# Patient Record
Sex: Male | Born: 1965
Health system: Southern US, Community
[De-identification: ages and names within clinical notes are randomized; demographics above are authoritative.]

## PROBLEM LIST (undated history)

## (undated) DIAGNOSIS — E785 Hyperlipidemia, unspecified: Secondary | ICD-10-CM

## (undated) HISTORY — DX: Hyperlipidemia, unspecified: E78.5

## (undated) HISTORY — PX: VASECTOMY: SHX75

## (undated) HISTORY — PX: WISDOM TOOTH EXTRACTION: SHX21

---

## 2001-02-16 ENCOUNTER — Encounter (INDEPENDENT_AMBULATORY_CARE_PROVIDER_SITE_OTHER): Payer: Self-pay | Admitting: Specialist

## 2001-02-16 ENCOUNTER — Other Ambulatory Visit: Admission: RE | Admit: 2001-02-16 | Discharge: 2001-02-16 | Payer: Self-pay | Admitting: Urology

## 2007-01-06 ENCOUNTER — Ambulatory Visit: Payer: Self-pay | Admitting: Family Medicine

## 2007-01-12 ENCOUNTER — Ambulatory Visit: Payer: Self-pay | Admitting: Family Medicine

## 2007-01-12 LAB — CONVERTED CEMR LAB
Albumin: 4 g/dL (ref 3.5–5.2)
Alkaline Phosphatase: 72 units/L (ref 39–117)
BUN: 14 mg/dL (ref 6–23)
Bacteria, UA: NEGATIVE
Basophils Absolute: 0 10*3/uL (ref 0.0–0.1)
Basophils Relative: 0.3 % (ref 0.0–1.0)
Bilirubin Urine: NEGATIVE
Chloride: 103 meq/L (ref 96–112)
Cholesterol: 174 mg/dL (ref 0–200)
Creatinine, Ser: 1 mg/dL (ref 0.4–1.5)
Crystals: NEGATIVE
GFR calc non Af Amer: 89 mL/min
HDL: 24.6 mg/dL — ABNORMAL LOW (ref >39.0)
Hemoglobin: 14.8 g/dL (ref 13.0–17.0)
LDL Cholesterol: 121 mg/dL — ABNORMAL HIGH (ref 0–99)
MCHC: 34.9 g/dL (ref 30.0–36.0)
Monocytes Absolute: 0.4 10*3/uL (ref 0.2–0.7)
Monocytes Relative: 6.8 % (ref 3.0–11.0)
Nitrite: NEGATIVE
Platelets: 234 10*3/uL (ref 150–400)
Potassium: 3.8 meq/L (ref 3.5–5.1)
RBC: 4.65 M/uL (ref 4.22–5.81)
RDW: 11.7 % (ref 11.5–14.6)
Sodium: 138 meq/L (ref 135–145)
Specific Gravity, Urine: >=1.03 (ref 1.000–1.03)
TSH: 0.82 microintl units/mL (ref 0.35–5.50)
Total CHOL/HDL Ratio: 7.1
Triglycerides: 140 mg/dL (ref 0–149)
Urine Glucose: NEGATIVE mg/dL
VLDL: 28 mg/dL (ref 0–40)
pH: 6 (ref 5.0–8.0)

## 2007-01-19 ENCOUNTER — Ambulatory Visit: Payer: Self-pay | Admitting: Family Medicine

## 2008-01-28 ENCOUNTER — Ambulatory Visit: Payer: Self-pay | Admitting: Family Medicine

## 2008-01-28 DIAGNOSIS — F528 Other sexual dysfunction not due to a substance or known physiological condition: Secondary | ICD-10-CM

## 2008-08-24 ENCOUNTER — Ambulatory Visit: Payer: Self-pay | Admitting: Family Medicine

## 2010-03-23 ENCOUNTER — Telehealth (INDEPENDENT_AMBULATORY_CARE_PROVIDER_SITE_OTHER): Payer: Self-pay | Admitting: *Deleted

## 2010-04-09 ENCOUNTER — Ambulatory Visit: Payer: Self-pay | Admitting: Internal Medicine

## 2010-04-09 DIAGNOSIS — E785 Hyperlipidemia, unspecified: Secondary | ICD-10-CM

## 2010-04-09 DIAGNOSIS — F411 Generalized anxiety disorder: Secondary | ICD-10-CM | POA: Insufficient documentation

## 2010-04-09 DIAGNOSIS — R030 Elevated blood-pressure reading, without diagnosis of hypertension: Secondary | ICD-10-CM

## 2010-04-09 DIAGNOSIS — R9431 Abnormal electrocardiogram [ECG] [EKG]: Secondary | ICD-10-CM

## 2010-04-12 ENCOUNTER — Telehealth (INDEPENDENT_AMBULATORY_CARE_PROVIDER_SITE_OTHER): Payer: Self-pay | Admitting: *Deleted

## 2010-05-09 ENCOUNTER — Telehealth (INDEPENDENT_AMBULATORY_CARE_PROVIDER_SITE_OTHER): Payer: Self-pay | Admitting: *Deleted

## 2010-07-31 ENCOUNTER — Ambulatory Visit: Payer: Self-pay | Admitting: Internal Medicine

## 2010-07-31 DIAGNOSIS — J069 Acute upper respiratory infection, unspecified: Secondary | ICD-10-CM | POA: Insufficient documentation

## 2010-11-25 LAB — CONVERTED CEMR LAB
AST: 21 units/L (ref 0–37)
Alkaline Phosphatase: 65 units/L (ref 39–117)
Basophils Relative: 0.3 % (ref 0.0–3.0)
Bilirubin Urine: NEGATIVE
Bilirubin, Direct: 0.1 mg/dL (ref 0.0–0.3)
Calcium: 9.2 mg/dL (ref 8.4–10.5)
Cholesterol, target level: 200 mg/dL
Creatinine, Ser: 1.1 mg/dL (ref 0.4–1.5)
Eosinophils Absolute: 0.1 10*3/uL (ref 0.0–0.7)
Eosinophils Relative: 1.1 % (ref 0.0–5.0)
GFR calc non Af Amer: 81.6 mL/min (ref >60)
Glucose, Urine, Semiquant: NEGATIVE
HDL goal, serum: 40 mg/dL
Lymphocytes Relative: 29.2 % (ref 12.0–46.0)
MCHC: 35.8 g/dL (ref 30.0–36.0)
Monocytes Relative: 5.5 % (ref 3.0–12.0)
Neutrophils Relative %: 63.9 % (ref 43.0–77.0)
RBC: 4.78 M/uL (ref 4.22–5.81)
Sodium: 142 meq/L (ref 135–145)
Specific Gravity, Urine: 1.025
Total Protein: 7.1 g/dL (ref 6.0–8.3)
WBC Urine, dipstick: NEGATIVE
WBC: 6.2 10*3/uL (ref 4.5–10.5)
pH: 5

## 2010-11-27 NOTE — Progress Notes (Signed)
Summary: Moving PCP's  Phone Note Call from Patient Call back at Home Phone [phone] Call back at [phone]   Caller: Patient's Wife Summary of Call: Patient would like to move to one of the physcians's here simply for location reasons. He works on this side of town and it would be closer for him. He would be moving to see Dr. Alwyn Ren. Is this ok? Initial call taken by: Harold Barban,  Mar 23, 2010 2:49 PM  Follow-up for Phone Call        of course that's fine Follow-up by: Nelwyn Salisbury MD,  Mar 23, 2010 2:57 PM  Additional Follow-up for Phone Call Additional follow up Details #1::        Called patient and scheduled an appt with Dr. Alwyn Ren. Additional Follow-up by: Harold Barban,  Mar 23, 2010 3:00 PM

## 2010-11-27 NOTE — Assessment & Plan Note (Signed)
Summary: COUGH AND COLD///SPH   Vital Signs:  Patient profile:   45 year old male Height:      72.25 inches Weight:      209 pounds O2 Sat:      98 % on Room air Temp:     98.0 degrees F oral Pulse rate:   78 / minute Resp:     14 per minute BP sitting:   110 / 82  (left arm)  Vitals Entered By: Jeremy Johann CMA (July 31, 2010 12:56 PM)  O2 Flow:  Room air CC: cough, congestion, URI symptoms   CC:  cough, congestion, and URI symptoms.  History of Present Illness: RTI Symptoms      This is a 45 year old man who presents with  symptoms since 07/28/2010.  The patient reports nasal congestion, purulent nasal discharge, sore throat, and productive cough with  yellow sputum, but denies earache.  Associated symptoms include dyspnea.  The patient denies fever and wheezing.  The patient denies headache.  Risk factors for Strep sinusitis include bilateral facial pain.  The patient denies the following risk factors for Strep sinusitis: tooth pain and tender adenopathy.  Rx: Nyquil; cough drops.                      Hyperlipidemia Follow-Up       NMR results were reviewed; TG were 311. Role of High Fructose Corn Syrup causing TG elevation discussed. Dietary compliance has been good; he has decreased sugar intake , especially colas, with 15# weight loss.  The patient reports no exercise; he is not having chest pain. He believes  chest pain was stress related.  Adjunctive measures currently used by the patient include ASA every other day.   Current Medications (verified): 1)  Aspirin 81 Mg  Tbec (Aspirin) .... As Needed 2)  Fluoxetine Hcl 10 Mg Caps (Fluoxetine Hcl) .Marland Kitchen.. 1 Once Daily  Allergies (verified): No Known Drug Allergies  Physical Exam  General:  in no acute distress; alert,appropriate and cooperative throughout examination Ears:  External ear exam shows no significant lesions or deformities.  Otoscopic examination reveals clear canals, tympanic membranes are intact bilaterally  without bulging, retraction, inflammation or discharge. Hearing is grossly normal bilaterally. Nose:  External nasal examination shows no deformity or inflammation. Nasal mucosa : erythema Mouth:  Oral mucosa and oropharynx without lesions or exudates.  Teeth in good repair. Mild: pharyngeal erythema.   Lungs:  Normal respiratory effort, chest expands symmetrically. Lungs are clear to auscultation, no crackles or wheezes. Heart:  Normal rate and regular rhythm. S1 and S2 normal without gallop, murmur, click, rub.S4 Skin:  Intact without suspicious lesions or rashes Cervical Nodes:  No lymphadenopathy noted Axillary Nodes:  No palpable lymphadenopathy Psych:  Focused & intelligent   Impression & Recommendations:  Problem # 1:  URI (ICD-465.9)  His updated medication list for this problem includes:    Aspirin 81 Mg Tbec (Aspirin) .Marland Kitchen... As needed    Hydromet 5-1.5 Mg/65ml Syrp (Hydrocodone-homatropine) .Marland Kitchen... 1 tsp every 6 hrs as needed cough  Problem # 2:  BRONCHITIS-ACUTE (ICD-466.0)  His updated medication list for this problem includes:    Amoxicillin 500 Mg Caps (Amoxicillin) .Marland Kitchen... 1 three times a day    Hydromet 5-1.5 Mg/45ml Syrp (Hydrocodone-homatropine) .Marland Kitchen... 1 tsp every 6 hrs as needed cough  Problem # 3:  HYPERLIPIDEMIA (ICD-272.4)  Complete Medication List: 1)  Aspirin 81 Mg Tbec (Aspirin) .... As needed 2)  Fluoxetine  Hcl 10 Mg Caps (Fluoxetine hcl) .Marland Kitchen.. 1 once daily 3)  Amoxicillin 500 Mg Caps (Amoxicillin) .Marland Kitchen.. 1 three times a day 4)  Hydromet 5-1.5 Mg/27ml Syrp (Hydrocodone-homatropine) .Marland Kitchen.. 1 tsp every 6 hrs as needed cough  Patient Instructions: 1)  Neti pot once daily as needed for head congestion. 2)  Drink as much NON dairy fluid as you can tolerate for the next few days. Consume LESS THAN 40 grams of HFCS sugar/ day as discussed. Check fasting Lipids annually. 3)  It is important that you exercise regularly at least 20 minutes 5 times a week. If you develop chest  pain, have severe difficulty breathing, or feel very tired , stop exercising immediately and seek medical attention. 4)  Take an 81 mg coated Aspirin every day. Prescriptions: HYDROMET 5-1.5 MG/5ML SYRP (HYDROCODONE-HOMATROPINE) 1 tsp every 6 hrs as needed cough  #120 cc x 0   Entered and Authorized by:   Marga Melnick MD   Signed by:   Marga Melnick MD on 07/31/2010   Method used:   Print then Give to Patient   RxID:   [id number] AMOXICILLIN 500 MG CAPS (AMOXICILLIN) 1 three times a day  #30 x 0   Entered and Authorized by:   Marga Melnick MD   Signed by:   Marga Melnick MD on 07/31/2010   Method used:   Print then Give to Patient   RxID:   [id number]

## 2010-11-27 NOTE — Progress Notes (Signed)
Summary: Cardiolite Denial  Phone Note Outgoing Call   Summary of Call: I tried to call and add the information to the case but since it has been fully denied it has to be appealed by a MD. A peer to peer review needs to be done for this denial to be appealed. Please call [phone] to inatiate this. Or you can let me know when you are ready to call and I will call and set everything up for you.  Initial call taken by: Harold Barban,  May 09, 2010 9:35 AM  Follow-up for Phone Call        Let him know his insurance has denied the Nuclear Stress Test. He should report any chest pain ,especially with exertion.  A routine cholesterol panel needs to be repeated after 4 months  of nutritional changes ( Consume less than 40 grams of sugar / day from foods & drinks with High Fructose Corn Syrup as #1, 2 or #3 on label as HFCS raises Triglycerides). Monitor BP; goal = average < 135/85. Hopp Follow-up by: Marga Melnick MD,  May 20, 2010 11:30 AM  Additional Follow-up for Phone Call Additional follow up Details #1::        I called patient on home number-no a working number, I called patient at work and was told he was not in to try agian later Additional Follow-up by: Shonna Chock CMA,  May 21, 2010 4:00 PM    Additional Follow-up for Phone Call Additional follow up Details #2::    I checked reg screen and called patient on cell:[phone], patient ok'd all information and schedule appointment for 08/2010 to check chlosterol./Chrae [facility] CMA  May 21, 2010 4:12 PM

## 2010-11-27 NOTE — Progress Notes (Signed)
Summary: Cardiolite Denial  Phone Note Other Incoming   Caller: AIM Summary of Call: Patient's insurance will not authorize his caridolite. It went to a MD level and they denied it.   They stated: Based upon additional physciann review, this request cannot be approved at this time because there was no above intermediate pre-test probability of coronary artery disease calculation based on age,gender, and sypmtoms or above moderate risk documentation such as HTN, DM, or abnormal lipid profile.   Please advise. Initial call taken by: Harold Barban,  April 12, 2010 11:14 AM  Follow-up for Phone Call        I request a doctor to doctor review. He has an abnormal EKG with ST-T changes inferolaterally. 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 I am concerned about him continuing to exercise with this worrisome complex.I request review of these risks as support for stress test. WF Hopper FACP,FCCP  Additional Follow-up by: Marga Melnick MD,  April 20, 2010 4:52 PM    Additional Follow-up for Phone Call Additional follow up Details #2::    Called and spoke with representative @ insurance and she said we can appeal the claim, but we will have to wait for Dr. Alwyn Ren to be back in the office to perform the doctor to doctor review. Will call back tuesday when you return.  Follow-up by: Harold Barban,  April 26, 2010 9:21 AM  Additional Follow-up for Phone Call Additional follow up Details #3:: Details for Additional Follow-up Action Taken: Noted;  please send patient copy of all phone notes to keep him informed of process Additional Follow-up by: Marga Melnick MD,  April 26, 2010 9:23 AM

## 2010-11-27 NOTE — Assessment & Plan Note (Signed)
Summary: reest//pt requesting a cpx//lch   Vital Signs:  Patient profile:   45 year old male Height:      72.25 inches Weight:      212.4 pounds BMI:     28.71 Temp:     97.8 degrees F oral Pulse rate:   76 / minute Resp:     14 per minute BP sitting:   126 / 84  (left arm) Cuff size:   large  Vitals Entered By: Shonna Chock (April 09, 2010 11:35 AM) CC: New Establish-CPX with fasting labs , Hypertension Management, Lipid Management Comments Not taking and RX'ed meds   CC:  New Establish-CPX with fasting labs , Hypertension Management, and Lipid Management.  History of Present Illness: Jason Benson is here for a physical; he is concerned about  elevated  BP readings pre & post exercise. BP 146/67 pre & 167/? 84 post. He is exercising @ a cardio level.   Hypertension History:      He denies headache, chest pain, palpitations, dyspnea with exertion, orthopnea, PND, peripheral edema, visual symptoms, neurologic problems, and syncope.  Headache only with prolonged fasting.        Positive major cardiovascular risk factors include hyperlipidemia.  Negative major cardiovascular risk factors include male age less than 70 years old, no history of diabetes, no history of hypertension, negative family history for ischemic heart disease, and non-tobacco-user status.        Further assessment for target organ damage reveals no history of ASHD, stroke/TIA, or peripheral vascular disease.    Lipid Management History:      Positive NCEP/ATP III risk factors include HDL cholesterol less than 40.  Negative NCEP/ATP III risk factors include male age less than 57 years old, non-diabetic, no family history for ischemic heart disease, non-tobacco-user status, non-hypertensive, no ASHD (atherosclerotic heart disease), no prior stroke/TIA, no peripheral vascular disease, and no history of aortic aneurysm.      Preventive Screening-Counseling & Management  Caffeine-Diet-Exercise     Does Patient Exercise:  yes  Allergies (verified): No Known Drug Allergies  Past History:  Past Medical History: Allergies Hyperlipidemia  Past Surgical History: Vasectomy ; Wisdom Teeth extraction  Family History: Father: DM,OA, MI Mother: negative Siblings: negative; P uncle : ? lung cancer  Social History: Occupation:Restaurant Owner Married Never Smoked Alcohol use-no Regular exercise-yes: Ellipitical X 30 min & weights X 30 min every other day w/o symptoms Does Patient Exercise:  yes  Review of Systems       The patient complains of anorexia.  The patient denies fever, weight loss, weight gain, vision loss, decreased hearing, hoarseness, prolonged cough, hemoptysis, abdominal pain, melena, hematochezia, severe indigestion/heartburn, hematuria, suspicious skin lesions, unusual weight change, abnormal bleeding, enlarged lymph nodes, and angioedema.   Neuro:  Denies disturbances in coordination, numbness, poor balance, and tingling. Psych:  Complains of anxiety, depression, easily angered, easily tearful, and irritability; He has stress of  owning 4 restaurants ; also family  membersstill live in [place].  Physical Exam  General:  well-nourished,in no acute distress; alert,appropriate and cooperative throughout examination Head:  Normocephalic and atraumatic without obvious abnormalities. No apparent alopecia Eyes:  No corneal or conjunctival inflammation noted.Perrla. Funduscopic exam benign, without hemorrhages, exudates or papilledema.  Ears:  External ear exam shows no significant lesions or deformities.  Otoscopic examination reveals clear canals, tympanic membranes are intact bilaterally without bulging, retraction, inflammation or discharge. Hearing is grossly normal bilaterally. Nose:  External nasal examination shows no deformity or inflammation. Nasal  mucosa are pink and moist without lesions or exudates. Mouth:  Oral mucosa and oropharynx without lesions or exudates.  Teeth in good  repair. Neck:  No deformities, masses, or tenderness noted. Lungs:  Normal respiratory effort, chest expands symmetrically. Lungs are clear to auscultation, no crackles or wheezes. Heart:  Normal rate and regular rhythm. S1 and S2 normal without gallop, murmur, click, rub. S4 Abdomen:  Bowel sounds positive,abdomen soft and non-tender without masses, organomegaly or hernias noted. Rectal:  No external abnormalities noted. Normal sphincter tone. No rectal masses or tenderness. Genitalia:  Testes bilaterally descended without nodularity, tenderness or masses. No scrotal masses or lesions. No penis lesions or urethral discharge. L varicocele.   Prostate:  Prostate gland firm and smooth, no enlargement, nodularity, tenderness, mass, asymmetry or induration. Msk:  No deformity or scoliosis noted of thoracic or lumbar spine.   Pulses:  R and L carotid,radial,dorsalis pedis and posterior tibial pulses are full and equal bilaterally Extremities:  No clubbing, cyanosis, edema, or deformity noted with normal full range of motion of all joints.   Neurologic:  alert & oriented X3, gait normal, and DTRs symmetrical and normal.   Skin:  Intact without suspicious lesions or rashes Cervical Nodes:  No lymphadenopathy noted Axillary Nodes:  No palpable lymphadenopathy Inguinal Nodes:  No significant adenopathy Psych:  memory intact for recent and remote, normally interactive, good eye contact, not anxious appearing, and not depressed appearing.     Impression & Recommendations:  Problem # 1:  ROUTINE GENERAL MEDICAL EXAM@HEALTH  CARE FACL (ICD-V70.0)  Orders: EKG w/ Interpretation (93000) Venipuncture (16109) TLB-BMP (Basic Metabolic Panel-BMET) (80048-METABOL) TLB-CBC Platelet - w/Differential (85025-CBCD) TLB-Hepatic/Liver Function Pnl (80076-HEPATIC) TLB-TSH (Thyroid Stimulating Hormone) (84443-TSH) UA Dipstick w/o Micro (manual) (60454) Specimen Handling (99000) T-Culture, Urine  (09811-91478)  Problem # 2:  ELEVATED BLOOD PRESSURE WITHOUT DIAGNOSIS OF HYPERTENSION (ICD-796.2)  Orders: EKG w/ Interpretation (93000) Venipuncture (29562) TLB-BMP (Basic Metabolic Panel-BMET) (80048-METABOL) T- * Misc. Laboratory test [phone]) Cardiolite (Cardiolite)  Problem # 3:  HYPERLIPIDEMIA (ICD-272.4)  Orders: Venipuncture (57846) Cardiolite (Cardiolite)  Problem # 4:  ANXIETY STATE, UNSPECIFIED (ICD-300.00)  The following medications were removed from the medication list:    Lexapro 10 Mg Tabs (Escitalopram oxalate) ..... Once daily His updated medication list for this problem includes:    Fluoxetine Hcl 10 Mg Caps (Fluoxetine hcl) .Marland Kitchen... 1 once daily  Problem # 5:  FAMILY HISTORY OF ISCHEMIC HEART DISEASE (ICD-V17.3)  Orders: Venipuncture (96295) T- * Misc. Laboratory test [phone]) Cardiolite (Cardiolite)  Complete Medication List: 1)  Aspirin 81 Mg Tbec (Aspirin) .... As needed 2)  Fluoxetine Hcl 10 Mg Caps (Fluoxetine hcl) .Marland Kitchen.. 1 once daily  Other Orders: Tdap => 77yrs IM (24401) Admin 1st Vaccine (02725)  Hypertension Assessment/Plan:      The patient's hypertensive risk group is category B: At least one risk factor (excluding diabetes) with no target organ damage.  His calculated 10 year risk of coronary heart disease is 6 %.  Today's blood pressure is 126/84.    Lipid Assessment/Plan:      Based on NCEP/ATP III, the patient's risk factor category is "0-1 risk factors".  The patient's lipid goals are as follows: Total cholesterol goal is 200; LDL cholesterol goal is 160; HDL cholesterol goal is 40; Triglyceride goal is 150.    Patient Instructions: 1)  Please keep appt for stress test Prescriptions: FLUOXETINE HCL 10 MG CAPS (FLUOXETINE HCL) 1 once daily  #30 x 5   Entered and Authorized by:  Marga Melnick MD   Signed by:   Marga Melnick MD on 04/09/2010   Method used:   Print then Give to Patient   RxID:   [id number]    Immunizations  Administered:  Tetanus Vaccine:    Vaccine Type: Tdap    Site: right deltoid    Mfr: GlaxoSmithKline    Dose: 0.5 ml    Route: IM    Given by: Chrae Malloy    Exp. Date: 01/20/2012    Lot #: [id number]    VIS given: 09/15/07 version given April 09, 2010.    Laboratory Results   Urine Tests   Date/Time Reported: April 09, 2010 1:06 PM   Routine Urinalysis   Color: yellow Appearance: Clear Glucose: negative   (Normal Range: Negative) Bilirubin: negative   (Normal Range: Negative) Ketone: negative   (Normal Range: Negative) Spec. Gravity: 1.025   (Normal Range: 1.003-1.035) Blood: trace-intact   (Normal Range: Negative) pH: 5.0   (Normal Range: 5.0-8.0) Protein: negative   (Normal Range: Negative) Urobilinogen: negative   (Normal Range: 0-1) Nitrite: negative   (Normal Range: Negative) Leukocyte Esterace: negative   (Normal Range: Negative)    Comments: Floydene Flock  April 09, 2010 1:07 PM cx sent

## 2011-03-15 NOTE — Assessment & Plan Note (Signed)
[facility]                            [facility] OFFICE NOTE   NAME:Jason Benson, Jason Benson                      MRN:          [id number]  DATE:01/19/2007                            DOB:          01-03-1966    This is a 45 year old gentleman here for a complete physical  examination.  In general, he is doing well except for some continued  problems of a sinus infection.  I saw him on March 11 and diagnosed him  with a sinusitis.  I prescribed a 10-day course of Biaxin XL 500 mg to  take two tablets a day.  He could only take 4 days' worth of this,  however, due to diarrhea and then he stopped it.  He says his sinus  infection is much better but he still has continued symptoms of sinus  pressure, postnasal drainage, dry cough, etc.  For further details of  his past medical history, family history, social history, habits, etc.,  refer to our last physical note dated June 08, 2004.   ALLERGIES:  None.   CURRENT MEDICATIONS:  None.   OBJECTIVE:  VITAL SIGNS:  Height 6 feet 0 inches, weight 198, BP 102/74,  pulse 80 and regular, temperature 97.9 degrees.  GENERAL:  He appears to be healthy.  Skin is clear.  HEENT:  Eyes are clear, ears clear, pharynx clear.  NECK:  Supple without lymphadenopathy or masses.  LUNGS:  Clear.  CARDIAC:  Rate and rhythm regular without gallops, murmurs, rubs.  Distal pulses are full.  ABDOMEN:  Soft, normal bowel sounds, nontender, no masses.  GENITALIA:  Normal male.  EXTREMITIES:  No clubbing, cyanosis, or edema.  NEUROLOGIC:  Grossly intact.   He was here for fasting labs on March 17.  These were all normal except  for his lipid panel.  HDL was quite low at 24 and LDL was high at 121.   ASSESSMENT AND PLAN:  1. Complete physical.  I encouraged him to get more regular exercise.  2. Dyslipidemia.  We talked about dietary changes he could make.  3. Partially treated sinusitis.  Omnicef 300 mg two capsules a day for  the next 7 days.     Tera Mater. Clent Ridges, MD  Electronically Signed    SAF/MedQ  DD: 01/20/2007  DT: 01/20/2007  Job #: [id number]

## 2012-01-21 ENCOUNTER — Ambulatory Visit (INDEPENDENT_AMBULATORY_CARE_PROVIDER_SITE_OTHER): Payer: Self-pay | Admitting: Internal Medicine

## 2012-01-21 ENCOUNTER — Telehealth: Payer: Self-pay | Admitting: Internal Medicine

## 2012-01-21 ENCOUNTER — Encounter: Payer: Self-pay | Admitting: Internal Medicine

## 2012-01-21 VITALS — BP 128/90 | HR 60 | Temp 98.1°F | Wt 216.0 lb

## 2012-01-21 DIAGNOSIS — J019 Acute sinusitis, unspecified: Secondary | ICD-10-CM

## 2012-01-21 DIAGNOSIS — J209 Acute bronchitis, unspecified: Secondary | ICD-10-CM

## 2012-01-21 MED ORDER — HYDROCODONE-HOMATROPINE 5-1.5 MG/5ML PO SYRP: 5.0000 mL | ORAL_SOLUTION | Freq: Four times a day (QID) | ORAL | Status: AC | PRN

## 2012-01-21 MED ORDER — AMOXICILLIN 500 MG PO CAPS: 500.0000 mg | ORAL_CAPSULE | Freq: Three times a day (TID) | ORAL | Status: AC

## 2012-01-21 NOTE — Progress Notes (Signed)
  Subjective:    [name], male    DOB: 1966-08-06, 46 y.o.   MRN: [id number]  HPI Respiratory tract infection Onset/symptoms:01/12/12 as dry cough Exposures (illness/environmental/extrinsic):daughter ill Progression of symptoms:to head congestion Treatments/response:Zicam, ASA with some benefit Present symptoms: Fever/chills/sweats:no Frontal headache:yes Facial pain:yes Nasal purulence:yes Sore throat:yes Dental pain:yes Lymphadenopathy:no Wheezing/shortness of breath:SOB Cough/sputum/hemoptysis:light yellow Pleuritic pain:no Associated extrinsic/allergic symptoms:itchy eyes/ sneezing:no Past medical history: Seasonal allergies /asthma:no Smoking history:never           Review of Systems He does have epistaxis in the winter from nasal drying; he has been using a humidifier and intranasal Vicks salve     Objective:   Physical Exam General appearance:good health ;well nourished; no acute distress or increased work of breathing is present.  No  lymphadenopathy about the head, neck, or axilla noted.   Eyes: No conjunctival inflammation or lid edema is present.   Ears:  External ear exam shows no significant lesions or deformities.  Otoscopic examination reveals clear canals, tympanic membranes are intact bilaterally without bulging, retraction, inflammation or discharge.  Nose:  External nasal examination shows no deformity or inflammation. Nasal mucosa are dry & erythematous especially on R; no lesions or exudates. No septal dislocation .No obstruction to airflow.   Oral exam: Dental hygiene is good; lips and gums are healthy appearing.There is no oropharyngeal erythema or exudate noted.   Neck:  No deformities, thyromegaly, masses, or tenderness noted.   Supple with full range of motion without pain.   Heart:  Normal rate and regular rhythm. S1 and S2 normal without gallop, murmur, click, rub or other extra sounds.   Lungs:Chest clear to  auscultation; no wheezes, rhonchi,rales ,or rubs present.No increased work of breathing.    Extremities:  No cyanosis, edema, or clubbing  noted    Skin: Warm & dry           Assessment & Plan:  #1 sinusitis, acute  #2 bronchitis with scant sputum  #3 epistaxis from nasal drying  Plan: See orders and recommendations

## 2012-01-21 NOTE — Telephone Encounter (Signed)
Patient called & stated pharmacy did not get cough syrup script. Please resend below  HYDROcodone-homatropine (HYDROMET) 5-1.5 MG/5ML syrup 120 mL 0 01/21/2012 01/31/2012 Take 5 mLs by mouth every 6 (six) hours as needed for cough. - Oral

## 2012-01-21 NOTE — Telephone Encounter (Signed)
RX was faxed

## 2012-01-21 NOTE — Patient Instructions (Signed)
Plain Mucinex for thick secretions ;force NON dairy fluids. Use a Neti pot daily as needed for sinus congestion. Nasal cleansing in the shower as discussed. Make sure that all residual soap is removed to prevent irritation. Use Eucerin  twice a day  for the drying.

## 2012-08-17 ENCOUNTER — Ambulatory Visit (INDEPENDENT_AMBULATORY_CARE_PROVIDER_SITE_OTHER): Payer: BC Managed Care – PPO | Admitting: Internal Medicine

## 2012-08-17 ENCOUNTER — Encounter: Payer: Self-pay | Admitting: Internal Medicine

## 2012-08-17 VITALS — BP 118/84 | HR 74 | Temp 98.1°F | Wt 212.2 lb

## 2012-08-17 DIAGNOSIS — Z23 Encounter for immunization: Secondary | ICD-10-CM

## 2012-08-17 DIAGNOSIS — Z9189 Other specified personal risk factors, not elsewhere classified: Secondary | ICD-10-CM

## 2012-08-17 DIAGNOSIS — Z202 Contact with and (suspected) exposure to infections with a predominantly sexual mode of transmission: Secondary | ICD-10-CM

## 2012-08-17 MED ORDER — DOXYCYCLINE HYCLATE 100 MG PO TABS: 100.0000 mg | ORAL_TABLET | Freq: Two times a day (BID) | ORAL | Status: DC

## 2012-08-17 NOTE — Patient Instructions (Addendum)
Avoid direct sun while on this medication. Avoid unprotected sex for at least 7 days after starting the medication

## 2012-08-17 NOTE — Progress Notes (Signed)
  Subjective:    [name], male    DOB: 05/14/1966, 46 y.o.   MRN: [id number]  HPI He may have been exposed to chlamydia approximately 3 weeks ago; his partner was diagnosed with this and has been treated. He does have some frequency but this appears to relate to volume of fluids he drinks; otherwise he is totally asymptomatic    Review of Systems He denies fever, chills, sweats, weight loss, dysuria, pyuria, or hematuria. He denies any genital lesions or other skin rash.     Objective:   Physical Exam General appearance is one of good health and nourishment w/o distress.  Eyes: No conjunctival inflammation or scleral icterus is present.  Oral exam: Dental hygiene is good; lips and gums are healthy appearing.There is no oropharyngeal erythema or exudate noted.   Heart:  Normal rate and regular rhythm. S1 and S2 normal without gallop, murmur, click, rub or other extra sounds     Lungs:Chest clear to auscultation; no wheezes, rhonchi,rales ,or rubs present.No increased work of breathing.   Abdomen: bowel sounds normal, soft and non-tender without masses, organomegaly or hernias noted.  No guarding or rebound   Skin:Warm & dry.  Intact without suspicious lesions or rashes   Genitalia normal except for left varices   Lymphatic: No lymphadenopathy is noted about the head, neck, axilla, or inguinal areas.              Assessment & Plan:  #1 possible STD exposure Plan: See orders and recommendations

## 2012-11-02 ENCOUNTER — Encounter: Payer: Self-pay | Admitting: Internal Medicine

## 2012-11-02 ENCOUNTER — Ambulatory Visit (INDEPENDENT_AMBULATORY_CARE_PROVIDER_SITE_OTHER): Payer: BC Managed Care – PPO | Admitting: Internal Medicine

## 2012-11-02 VITALS — BP 114/80 | HR 66 | Temp 97.8°F | Resp 12 | Ht 72.0 in | Wt 212.8 lb

## 2012-11-02 DIAGNOSIS — E785 Hyperlipidemia, unspecified: Secondary | ICD-10-CM

## 2012-11-02 DIAGNOSIS — R9431 Abnormal electrocardiogram [ECG] [EKG]: Secondary | ICD-10-CM

## 2012-11-02 DIAGNOSIS — Z0000 Encounter for general adult medical examination without abnormal findings: Secondary | ICD-10-CM

## 2012-11-02 NOTE — Patient Instructions (Addendum)
Please take enteric-coated aspirin 81 mg daily with breakfast.   If you activate My Chart; the results can be released to you as soon as they populate from the lab. If you choose not to use this program; the labs have to be reviewed, copied & mailed   causing a delay in getting the results to you.  Take the EKG to any emergency room or preop visits. There are nonspecific changes; as long as there is no new change these are not clinically significant.To prevent palpitations or premature beats, avoid stimulants such as decongestants, diet pills, nicotine, or caffeine (coffee, tea, cola, or chocolate) to excess.

## 2012-11-02 NOTE — Progress Notes (Signed)
  Subjective:    [name], male    DOB: [birth year], 47 y.o.   MRN: [id number]  HPI  Jason Benson is here for a physical; he denies acute issues.       Review of Systems He is on a heart healthy diet; he exercises 60 minutes 5 times per week without symptoms. Specifically he denies chest pain, palpitations, dyspnea, or claudication. Family history is negative for premature coronary disease. Advanced cholesterol testing reveals his LDL goal was less than 100.      Objective:   Physical Exam Gen.: Healthy and well-nourished in appearance. Alert, appropriate and cooperative throughout exam. Head: Normocephalic without obvious abnormalities Eyes: No corneal or conjunctival inflammation noted. Pupils equal round reactive to light and accommodation. Fundal exam is benign without hemorrhages, exudate, papilledema. Extraocular motion intact. Vision grossly normal. Ears: External  ear exam reveals no significant lesions or deformities. Canals clear .TMs normal. Hearing is grossly normal bilaterally. Nose: External nasal exam reveals no deformity or inflammation. Nasal mucosa are pink and moist. No lesions or exudates noted.  Mouth: Oral mucosa and oropharynx reveal no lesions or exudates. Teeth in good repair. Neck: No deformities, masses, or tenderness noted. Range of motion & Thyroid normal. Lungs: Normal respiratory effort; chest expands symmetrically. Lungs are clear to auscultation without rales, wheezes, or increased work of breathing. Heart: Normal rate and rhythm. Normal S1 and S2. No gallop, click, or rub. S4 w/o murmur. Abdomen: Bowel sounds normal; abdomen soft and nontender. No masses, organomegaly or hernias noted. Genitalia/ DRE: Genitalia normal except for large  left varices & vasectomy scar tissue. Prostate is normal without enlargement, asymmetry, nodularity, or induration.  Musculoskeletal/extremities: No deformity or scoliosis noted of  the thoracic or lumbar  spine. No clubbing, cyanosis, edema, or deformity noted. Range of motion  normal .Tone & strength  normal.Joints normal. Nail health  good. Vascular: Carotid, radial artery, dorsalis pedis and  posterior tibial pulses are full and equal. No bruits present. Neurologic: Alert and oriented x3. Deep tendon reflexes symmetrical and normal.          Skin: Intact without suspicious lesions or rashes. Lymph: No cervical, axillary, or inguinal lymphadenopathy present. Psych: Mood and affect are normal. Normally interactive                                                                                         Assessment & Plan:  #1 comprehensive physical exam; no acute findings  Plan: see Orders

## 2012-11-03 LAB — CBC WITH DIFFERENTIAL/PLATELET
Basophils Relative: 0.6 % (ref 0.0–3.0)
Eosinophils Relative: 0.7 % (ref 0.0–5.0)
Hemoglobin: 15.4 g/dL (ref 13.0–17.0)
Lymphocytes Relative: 31.9 % (ref 12.0–46.0)
MCHC: 34.6 g/dL (ref 30.0–36.0)
Monocytes Relative: 4.9 % (ref 3.0–12.0)
Neutro Abs: 3.6 10*3/uL (ref 1.4–7.7)
Neutrophils Relative %: 61.9 % (ref 43.0–77.0)
RBC: 4.87 Mil/uL (ref 4.22–5.81)
WBC: 5.9 10*3/uL (ref 4.5–10.5)

## 2012-11-03 LAB — BASIC METABOLIC PANEL
CO2: 30 mEq/L (ref 19–32)
Calcium: 9.8 mg/dL (ref 8.4–10.5)
GFR: 87.23 mL/min (ref >60.00)
Sodium: 139 mEq/L (ref 135–145)

## 2012-11-03 LAB — HEPATIC FUNCTION PANEL
ALT: 50 U/L (ref 0–53)
AST: 26 U/L (ref 0–37)
Albumin: 4.7 g/dL (ref 3.5–5.2)
Alkaline Phosphatase: 63 U/L (ref 39–117)
Bilirubin, Direct: 0.1 mg/dL (ref 0.0–0.3)
Total Protein: 8 g/dL (ref 6.0–8.3)

## 2012-11-05 ENCOUNTER — Encounter: Payer: Self-pay | Admitting: *Deleted

## 2012-11-10 ENCOUNTER — Telehealth: Payer: Self-pay

## 2012-11-10 ENCOUNTER — Encounter: Payer: Self-pay | Admitting: Internal Medicine

## 2012-11-10 MED ORDER — CHOLINE FENOFIBRATE 135 MG PO CPDR: 135.0000 mg | DELAYED_RELEASE_CAPSULE | Freq: Every day | ORAL | Status: DC

## 2012-11-10 NOTE — Telephone Encounter (Signed)
Message copied by Maurice Small on Tue Nov 10, 2012  4:57 PM ------      Message from: Pecola Lawless      Created: Tue Nov 10, 2012  1:09 PM       See NMR lipoprofile 11/02/12; scan & mail with Rx for Trilix 145 mg   330 , R X 2with repeat fasting labs in 10 weeks (lipids, hepatic panel, CK; 272.4, 995.20). The most common cause of elevated triglycerides is the ingestion of sugar from high fructose corn syrup sources added to processed foods & drinks.  Eat a low-fat diet with lots of fruits and vegetables, up to 7-9 servings per day. Consume less than 40 (ZERO is optimal) grams of sugar per day from foods & drinks with High Fructose Corn Syrup (HFCS) sugar as #1,2,3 or # 4 on label.[employer], [employer] & [employer] do not carry products with HFCS.

## 2012-11-26 ENCOUNTER — Encounter: Payer: Self-pay | Admitting: Internal Medicine

## 2012-12-28 ENCOUNTER — Ambulatory Visit: Payer: BC Managed Care – PPO | Admitting: Internal Medicine

## 2012-12-29 ENCOUNTER — Ambulatory Visit (INDEPENDENT_AMBULATORY_CARE_PROVIDER_SITE_OTHER): Payer: BC Managed Care – PPO | Admitting: Internal Medicine

## 2012-12-29 ENCOUNTER — Encounter: Payer: Self-pay | Admitting: Internal Medicine

## 2012-12-29 VITALS — BP 130/78 | HR 71 | Temp 98.0°F | Wt 215.0 lb

## 2012-12-29 DIAGNOSIS — M310 Hypersensitivity angiitis: Secondary | ICD-10-CM

## 2012-12-29 MED ORDER — MOMETASONE FUROATE 0.1 % EX OINT: TOPICAL_OINTMENT | Freq: Two times a day (BID) | CUTANEOUS | Status: DC

## 2012-12-29 MED ORDER — PREDNISONE 20 MG PO TABS: 20.0000 mg | ORAL_TABLET | Freq: Two times a day (BID) | ORAL | Status: DC

## 2012-12-29 NOTE — Progress Notes (Signed)
  Subjective:    [name], male    DOB: 09/16/1966, 47 y.o.   MRN: [id number]  HPI Skin lesions began as rash on left side of abdomen on 2/26. It has since spread to both arms and L leg. Lesions are not localized.The lesions are red, but were not described as pruritic, tender  or associated with temperature change.The dermatologic lesions have progressed since the onset.Treatment with antifungal cream was not effective.  There was no associated trigger or context such as exposure to animals ticks/insects new medications clothing or topical cosmetics. He was in [place] on a gambling junket and had ingested 4 lobsters prior to the onset of the rash           Review of Systems Constitutional: no fever ; chills; change in weight ; excessive fatigue Allergy/immunologic: no sneezing ; rhinitis ; angioedema ; itchy eyes ; hives Eyes: no blurred/double/loss of vision  ; redness ; discharge Ears, nose and throat/mouth: no nasal congestion ; sore throat ; earache ;  facial pain ;frontal headache  Respirations:no cough ; sputum  ; dyspnea; wheezing GI:no nausea/vomiting ; diarrhea ; abdominal pain ; melena ; rectal bleeding  GU: no dysuria ; discharge  Musculoskeletal: no joint swelling or redness  Neurologic:no numbness or tingling       Objective:   Physical Exam Gen.: Healthy and well-nourished in appearance. Alert, appropriate and cooperative throughout exam.  Eyes: No corneal or conjunctival inflammation noted.  Mouth: Oral mucosa and oropharynx reveal no lesions or exudates. Teeth in good repair. Neck: No deformities, masses, or tenderness noted.  Lungs: Normal respiratory effort; chest expands symmetrically. Lungs are clear to auscultation without rales, wheezes, or increased work of breathing. Heart: Normal rate and rhythm. Normal S1 and S2. No gallop, click, or rub. No murmur. Abdomen:  No masses, organomegaly or hernias noted.                        Musculoskeletal/extremities: No clubbing, cyanosis, edema noted. Nail health good.        Skin: 5.75 x 2 cm plaque-like lesion left lateral thorax. Smaller scattered plaques which are flat and which blanch over the left upper extremity and of the left ventral wrist. Other lesions on the left lower extremity. Mild dermatographia present Lymph: No cervical, axillary lymphadenopathy present. Psych: Mood and affect are normal. 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

## 2012-12-29 NOTE — Patient Instructions (Addendum)
Use Eucerin or Aveeno Daily  Moisturizing Lotion  twice a day  for  dry skin. Bathe with moisturizing liquid soap , not bar soap. Restrict hyperallergenic foods at this time: Nuts, strawberries, seafood , chocolate, and tomatoes.

## 2013-02-25 ENCOUNTER — Telehealth: Payer: Self-pay | Admitting: Internal Medicine

## 2013-02-25 NOTE — Telephone Encounter (Signed)
Left message on VM informing patient to return call when available. Reason for call (not left on VM), ? Why patient is requesting referral (last ov patient was here for rash)

## 2013-02-25 NOTE — Telephone Encounter (Signed)
Pt called to see if dr hopper could refer him to a dermatologist. Thanks

## 2013-02-26 NOTE — Telephone Encounter (Signed)
That was the working diagnosis for the recurrent dermatitis/rash

## 2013-02-26 NOTE — Telephone Encounter (Signed)
Leukocytoclastic vasculitis - Primary 446.29, Hopp please confirm this is the diagnosis code to attach to dermatology referral

## 2013-02-26 NOTE — Telephone Encounter (Signed)
Patient would like referral for rash/allergy he had last time he saw Dr. Alwyn Ren. It went away, but has returned.

## 2013-03-04 ENCOUNTER — Encounter: Payer: Self-pay | Admitting: Internal Medicine

## 2013-03-04 ENCOUNTER — Ambulatory Visit (INDEPENDENT_AMBULATORY_CARE_PROVIDER_SITE_OTHER): Payer: BC Managed Care – PPO | Admitting: Internal Medicine

## 2013-03-04 VITALS — BP 122/82 | HR 80 | Temp 98.0°F | Resp 16 | Wt 217.4 lb

## 2013-03-04 DIAGNOSIS — B354 Tinea corporis: Secondary | ICD-10-CM

## 2013-03-04 MED ORDER — KETOCONAZOLE 2 % EX CREA: TOPICAL_CREAM | Freq: Two times a day (BID) | CUTANEOUS | Status: DC

## 2013-03-04 NOTE — Progress Notes (Signed)
  Subjective:    [name], male    DOB: 06-07-1966, 47 y.o.   MRN: [id number]  HPI His symptoms began approximately 8 weeks ago as a diffuse rash of LUE & stomach attributed to ingesting lobster. He was prescribed generic mometasone and oral prednisone with good response.  Although he has not eaten shellfish the rash has recurred and 3-4 days after he stopped the medications.  He believes the trigger for the rash is getting overheated. The rash is nonpruritic and blanches with pressure. The rash now is mainly over the thighs. It tends to have an irregular border and dry center.  The rash preceded his starting a medication for elevated triglycerides    Review of Systems   He specifically denies fever, chills, sweats, or weight loss. He has no significant extrinsic symptoms but does have some mild sneezing  He has no wheezing or shortness of breath.  He's had no swelling of his lips or tongue.     Objective:   Physical Exam General appearance:good health ;well nourished; no acute distress or increased work of breathing is present.  No  lymphadenopathy about the head, neck, or axilla noted.   Eyes: No conjunctival inflammation or lid edema is present. There is no scleral icterus.   Oral exam: Dental hygiene is good; lips and gums are healthy appearing.There is no oropharyngeal erythema or exudate noted.   Neck:  No deformities,  masses, or tenderness noted.     Heart:  Normal rate and regular rhythm. S1 and S2 normal without gallop, murmur, click, rub or other extra sounds.   Lungs:Chest clear to auscultation; no wheezes, rhonchi,rales ,or rubs present.No increased work of breathing.    Abdomen: No organomegaly or masses  Extremities:  No cyanosis, edema, or clubbing  noted    Skin: Warm & dry . There are scattered gland appearing slightly erythematous lesions over the left upper extremity and especially over the thighs. There is an irregular border with a  dry central area suggesting possible tinea dermatitis         Assessment & Plan:  #1 recurrent dermatitis; rule out Tinea  Plan: Antifungal prep will be prescribed pending evaluation by a dermatologist. Scrapings will be necessary for definitive diagnosis.

## 2013-03-04 NOTE — Patient Instructions (Addendum)
Please review the record and make any corrections; share this with all medical staff seen. If you note change or progression in symptoms please contact us through My Chart ASAP. This will allow us to respond as quickly as possible and schedule appropriate studies (blood test or imaging). 

## 2014-07-24 ENCOUNTER — Ambulatory Visit (INDEPENDENT_AMBULATORY_CARE_PROVIDER_SITE_OTHER): Payer: 59 | Admitting: Family Medicine

## 2014-07-24 VITALS — BP 106/76 | HR 65 | Temp 97.8°F | Resp 24 | Ht 71.0 in | Wt 217.2 lb

## 2014-07-24 DIAGNOSIS — R05 Cough: Secondary | ICD-10-CM

## 2014-07-24 DIAGNOSIS — J069 Acute upper respiratory infection, unspecified: Secondary | ICD-10-CM

## 2014-07-24 DIAGNOSIS — R071 Chest pain on breathing: Secondary | ICD-10-CM

## 2014-07-24 DIAGNOSIS — R0781 Pleurodynia: Secondary | ICD-10-CM

## 2014-07-24 DIAGNOSIS — R059 Cough, unspecified: Secondary | ICD-10-CM

## 2014-07-24 MED ORDER — AMOXICILLIN 875 MG PO TABS: 875.0000 mg | ORAL_TABLET | Freq: Two times a day (BID) | ORAL | Status: DC

## 2014-07-24 MED ORDER — BENZONATATE 100 MG PO CAPS: 200.0000 mg | ORAL_CAPSULE | Freq: Two times a day (BID) | ORAL | Status: DC | PRN

## 2014-07-24 MED ORDER — HYDROCODONE-HOMATROPINE 5-1.5 MG/5ML PO SYRP: 5.0000 mL | ORAL_SOLUTION | Freq: Every evening | ORAL | Status: DC | PRN

## 2014-07-24 NOTE — Progress Notes (Signed)
Chief Complaint:  Chief Complaint  Patient presents with  . Cough    x 2 days  . Chest Congestion    HPI: Jason Benson is a 48 y.o. male who is here for  2 day history of cough, he has white productive cough, he has had minimal sore throat and some chest congestion,  Denies facial pain, ear pain, n/v/abd pain or diarrhea or rash  No one is sick at home, no fevers , was in [place] 2 days ago  For vacation and is back home, no hx of allergies  Past Medical History  Diagnosis Date  . Hyperlipidemia    Past Surgical History  Procedure Laterality Date  . Wisdom tooth extraction    . Vasectomy     History   Social History  . Marital Status: Married    Spouse Name: N/A    Number of Children: N/A  . Years of Education: N/A   Social History Main Topics  . Smoking status: Never Smoker   . Smokeless tobacco: Never Used  . Alcohol Use: No  . Drug Use: No  . Sexual Activity: None   Other Topics Concern  . None   Social History Narrative  . None   Family History  Problem Relation Age of Onset  . Diabetes Father   . Heart attack Father 55  . Lung cancer Paternal Uncle   . Stroke Mother 69  . Hyperlipidemia Sister    No Known Allergies Prior to Admission medications   Not on File     ROS: The patient denies fevers, chills, night sweats, unintentional weight loss, chest pain, palpitations, wheezing, dyspnea on exertion, nausea, vomiting, abdominal pain, dysuria, hematuria, melena, numbness, weakness, or tingling.   All other systems have been reviewed and were otherwise negative with the exception of those mentioned in the HPI and as above.    PHYSICAL EXAM: Filed Vitals:   07/24/14 1152  BP: 106/76  Pulse: 65  Temp: 97.8 F (36.6 C)  Resp: 24   Filed Vitals:   07/24/14 1152  Height:  (1.803 m)  Weight: 217 lb 4 oz (98.544 kg)   Body mass index is 30.31 kg/(m^2).  General: Alert, no acute distress HEENT:  Normocephalic, atraumatic,  oropharynx patent. EOMI, PERRLA, tm nl, no exudates, erythemaotus throat, +/- sinu spressure, boggy nares Cardiovascular:  Regular rate and rhythm, no rubs murmurs or gallops.  No Carotid bruits, radial pulse intact. No pedal edema.  Respiratory: Clear to auscultation bilaterally.  No wheezes, rales, or rhonchi.  No cyanosis, no use of accessory musculature GI: No organomegaly, abdomen is soft and non-tender, positive bowel sounds.  No masses. Skin: No rashes. Neurologic: Facial musculature symmetric. Psychiatric: Patient is appropriate throughout our interaction. Lymphatic: No cervical lymphadenopathy Musculoskeletal: Gait intact.   LABS: Results for orders placed in visit on 11/02/12  BASIC METABOLIC PANEL      Result Value Ref Range   Sodium 139  135 - 145 mEq/L   Potassium 4.0  3.5 - 5.1 mEq/L   Chloride 102  96 - 112 mEq/L   CO2 30  19 - 32 mEq/L   Glucose, Bld 91  70 - 99 mg/dL   BUN 16  6 - 23 mg/dL   Creatinine, Ser 1.0  0.4 - 1.5 mg/dL   Calcium 9.8  8.4 - 24.4 mg/dL   GFR 01.02  >72.53 mL/min  CBC WITH DIFFERENTIAL      Result Value Ref Range  WBC 5.9  4.5 - 10.5 K/uL   RBC 4.87  4.22 - 5.81 Mil/uL   Hemoglobin 15.4  13.0 - 17.0 g/dL   HCT 54.0  98.1 - 19.1 %   MCV 91.3  78.0 - 100.0 fl   MCHC 34.6  30.0 - 36.0 g/dL   RDW 47.8  29.5 - 62.1 %   Platelets 204.0  150.0 - 400.0 K/uL   Neutrophils Relative % 61.9  43.0 - 77.0 %   Lymphocytes Relative 31.9  12.0 - 46.0 %   Monocytes Relative 4.9  3.0 - 12.0 %   Eosinophils Relative 0.7  0.0 - 5.0 %   Basophils Relative 0.6  0.0 - 3.0 %   Neutro Abs 3.6  1.4 - 7.7 K/uL   Lymphs Abs 1.9  0.7 - 4.0 K/uL   Monocytes Absolute 0.3  0.1 - 1.0 K/uL   Eosinophils Absolute 0.0  0.0 - 0.7 K/uL   Basophils Absolute 0.0  0.0 - 0.1 K/uL  HEPATIC FUNCTION PANEL      Result Value Ref Range   Total Bilirubin 0.9  0.3 - 1.2 mg/dL   Bilirubin, Direct 0.1  0.0 - 0.3 mg/dL   Alkaline Phosphatase 63  39 - 117 U/L   AST 26  0 - 37 U/L     ALT 50  0 - 53 U/L   Total Protein 8.0  6.0 - 8.3 g/dL   Albumin 4.7  3.5 - 5.2 g/dL  TSH      Result Value Ref Range   TSH 0.67  0.35 - 5.50 uIU/mL  NMR, LIPOPROFILE      Result Value Ref Range   Sent to [facility]         EKG/XRAY:   Primary read interpreted by Dr. Conley Rolls at [facility].   ASSESSMENT/PLAN: Encounter Diagnoses  Name Primary?  . Cough Yes  . Pleuritic chest pain   . Acute URI    Likely viral or allergies  Declined xray Will try sxs treamtent with otc allegra or zyrtec, rx for abx given if sxs treatment do not work, she may take abx Rx tessalon perles hycodan Rx for amoxacillin   Gross sideeffects, risk and benefits, and alternatives of medications d/w patient. Patient is aware that all medications have potential sideeffects and we are unable to predict every sideeffect or drug-drug interaction that may occur.  LE, THAO PHUONG, DO 07/24/2014 1:15 PM

## 2015-04-05 ENCOUNTER — Encounter: Payer: Self-pay | Admitting: Internal Medicine

## 2015-04-05 ENCOUNTER — Ambulatory Visit (INDEPENDENT_AMBULATORY_CARE_PROVIDER_SITE_OTHER): Payer: 59 | Admitting: Internal Medicine

## 2015-04-05 VITALS — BP 134/86 | HR 71 | Temp 97.8°F | Resp 18 | Ht 72.0 in | Wt 214.0 lb

## 2015-04-05 DIAGNOSIS — Z0000 Encounter for general adult medical examination without abnormal findings: Secondary | ICD-10-CM | POA: Diagnosis not present

## 2015-04-05 NOTE — Patient Instructions (Addendum)
Minimal Blood Pressure Goal= AVERAGE < 140/90;  Ideal is an AVERAGE < 135/85. This AVERAGE should be calculated from @ least 5-7 BP readings taken @ different times of day on different days of week. You should not respond to isolated BP readings , but rather the AVERAGE for that week .Please bring your  blood pressure cuff to office visits to verify that it is reliable.It  can also be checked against the blood pressure device at the pharmacy. Finger or wrist cuffs are not dependable; an arm cuff is.  Your next office appointment will be determined based upon review of your pending labs. Those written interpretation of the lab results and instructions will be transmitted to you by mail for your records. Critical results will be called.  Use an anti-inflammatory cream such as Aspercreme or Zostrix cream twice a day to the shoulder area as needed. In lieu of this warm moist compresses or  hot water bottle can be used. Do not apply ice except immediately after weight work.  Followup as needed for any active or acute issue. Please report any significant change in your symptoms.

## 2015-04-05 NOTE — Progress Notes (Signed)
Pre visit review using our clinic review tool, if applicable. No additional management support is needed unless otherwise documented below in the visit note. 

## 2015-04-05 NOTE — Progress Notes (Signed)
Subjective:    Patient ID: Jason SharpAdel A Benson, male    DOB: 09-21-1966, 49 y.o.   MRN: [id number]  HPI He is here for a physical;acute issues include shoulder pain related to doing weight work.  He is on no prescription medications. He does restrict sugars and salt in his diet. He is following a Mediterranean diet. He exercises for 90 minutes 5 days a week without associated cardiopulmonary symptoms  He is not monitoring his blood pressure. He is on an 81 mg aspirin daily.  Other than that shoulder discomfort his only other symptom is decreased visual acuity. He has no GI, constitutional,or genitourinary symptoms.  His mother had a stroke in her 5780s in the context of losing her home in [place]. His father had diabetes.      Review of Systems  Chest pain, palpitations, tachycardia, exertional dyspnea, paroxysmal nocturnal dyspnea, claudication or edema are absent. No unexplained weight loss, abdominal pain, significant dyspepsia, dysphagia, melena, rectal bleeding, or persistently small caliber stools. Dysuria, pyuria, hematuria, frequency, nocturia or polyuria are denied. Change in hair, skin, nails denied. No bowel changes of constipation or diarrhea. No intolerance to heat or cold.     Objective:   Physical Exam Gen.: Adequately nourished in appearance. Alert, appropriate and cooperative throughout exam. BMI: Appears younger than stated age  Head: Normocephalic without obvious abnormalities; no alopecia  Eyes: No corneal or conjunctival inflammation noted. Pupils equal round reactive to light and accommodation. Extraocular motion intact.  Ears: External  ear exam reveals no significant lesions or deformities. Canals clear .TMs normal. Hearing is grossly normal bilaterally. Nose: External nasal exam reveals no deformity or inflammation. Nasal mucosa are pink and moist. No lesions or exudates noted.   Mouth: Oral mucosa and oropharynx reveal no lesions or exudates. Teeth in good  repair. Neck: No deformities, masses, or tenderness noted. Range of motion & Thyroid normal. Lungs: Normal respiratory effort; chest expands symmetrically. Lungs are clear to auscultation without rales, wheezes, or increased work of breathing. Heart: Normal rate and rhythm. Normal S1 and S2. No gallop, click, or rub. No murmur. Abdomen: Bowel sounds normal; abdomen soft and nontender. No masses, organomegaly or hernias noted. Genitalia: Genitalia normal except for large left varices. Prostate is normal without enlargement, asymmetry, nodularity, or induration                               Musculoskeletal/extremities: No deformity or scoliosis noted of  the thoracic or lumbar spine. No clubbing, cyanosis, edema, or significant extremity  deformity noted.  Range of motion normal . Tone & strength normal. Hand joints normal Fingernail  health good. Crepitus of knees  & left shoulder. Able to lie down & sit up w/o help.  Negative SLR bilaterally Vascular: Carotid, radial artery, dorsalis pedis and  posterior tibial pulses are full and equal. No bruits present. Neurologic: Alert and oriented x3. Deep tendon reflexes symmetrical and normal.  Gait normal   Skin: Intact without suspicious lesions or rashes. Lymph: No cervical, axillary, or inguinal lymphadenopathy present. Psych: Mood and affect are normal. Normally interactive  Assessment & Plan:  #1 comprehensive physical exam; no acute findings #2 shoulder impingement syndrome  Plan: see Orders  & Recommendations

## 2016-08-04 DIAGNOSIS — J0100 Acute maxillary sinusitis, unspecified: Secondary | ICD-10-CM | POA: Diagnosis not present

## 2016-08-04 DIAGNOSIS — J309 Allergic rhinitis, unspecified: Secondary | ICD-10-CM | POA: Diagnosis not present

## 2016-11-27 ENCOUNTER — Encounter: Payer: Self-pay | Admitting: Family Medicine

## 2016-11-27 ENCOUNTER — Ambulatory Visit (INDEPENDENT_AMBULATORY_CARE_PROVIDER_SITE_OTHER): Payer: 59 | Admitting: Family Medicine

## 2016-11-27 VITALS — BP 128/86 | HR 99 | Temp 98.3°F | Ht 72.0 in | Wt 218.0 lb

## 2016-11-27 DIAGNOSIS — Z23 Encounter for immunization: Secondary | ICD-10-CM

## 2016-11-27 DIAGNOSIS — E785 Hyperlipidemia, unspecified: Secondary | ICD-10-CM

## 2016-11-27 DIAGNOSIS — E663 Overweight: Secondary | ICD-10-CM | POA: Diagnosis not present

## 2016-11-27 DIAGNOSIS — Z8639 Personal history of other endocrine, nutritional and metabolic disease: Secondary | ICD-10-CM

## 2016-11-27 DIAGNOSIS — Z0000 Encounter for general adult medical examination without abnormal findings: Secondary | ICD-10-CM | POA: Diagnosis not present

## 2016-11-27 NOTE — Progress Notes (Signed)
Pre visit review using our clinic review tool, if applicable. No additional management support is needed unless otherwise documented below in the visit note. 

## 2016-11-27 NOTE — Patient Instructions (Signed)
Keep up the good work with your diet.  [facility] you at the [facility]!

## 2016-11-27 NOTE — Progress Notes (Signed)
Chief Complaint  Patient presents with  . Annual Exam    Well Male Jason Benson is here for a complete physical.   His last physical was >1 year ago.  Current diet: in general, a "healthy" diet   Current exercise: active with work;  Weight trend: stable  Does pt snore? No. Daytime fatigue? No. Seat belt? Yes.    Flu- has not gotten yet Td- 03/2010 [facility]- N/A HIV- done, no concerns  Past Medical History:  Diagnosis Date  . Hyperlipidemia     Past Surgical History:  Procedure Laterality Date  . VASECTOMY    . WISDOM TOOTH EXTRACTION     Medications  Current Outpatient Prescriptions on File Prior to Visit  Medication Sig Dispense Refill  . aspirin EC 81 MG tablet Take 81 mg by mouth daily.     Allergies No Known Allergies Family History Family History  Problem Relation Age of Onset  . Diabetes Father   . Heart attack Father [id number]  . Lung cancer Paternal Uncle   . Stroke Mother [phone]  . Hyperlipidemia Sister     Review of Systems: Constitutional:  no unexpected change in weight, no fevers or chills Eye:  +inability to read small font anymore Ear/Nose/Mouth/Throat:  Ears:  no tinnitus or hearing loss Nose/Mouth/Throat:  no complaints of nasal congestion or bleeding, no sore throat and oral sores Cardiovascular:  no chest pain, no palpitations Respiratory:  no cough and no shortness of breath Gastrointestinal:  no abdominal pain, no change in bowel habits, no nausea, vomiting, diarrhea, or constipation and no black or bloody stool GU:  Male: negative for dysuria, frequency, and incontinence and negative for prostate symptoms Musculoskeletal/Extremities:  no pain, redness, or swelling of the joints Integumentary (Skin/Breast):  no abnormal skin lesions reported Neurologic:  no headaches, no numbness, tingling Endocrine:  weight changes, masses in the neck, heat/cold intolerance, bowel or skin changes, or cardiovascular system symptoms Hematologic/Lymphatic:  no abnormal  bleeding, no HIV risk factors, no night sweats, no swollen nodes, no weight loss  Exam BP 128/86 (BP Location: Right Arm, Patient Position: Sitting, Cuff Size: Large)   Pulse 99   Temp 98.3 F (36.8 C) (Oral)   Ht 6' (1.829 m)   Wt 218 lb (98.9 kg)   SpO2 96%   BMI 29.57 kg/m  General:  well developed, well nourished, in no apparent distress Skin:  no significant moles, warts, or growths Head:  no masses, lesions, or tenderness Eyes:  pupils equal and round, sclera anicteric without injection Ears:  canals without lesions, TMs shiny without retraction, no obvious effusion, no erythema Nose:  nares patent, septum midline, mucosa normal Throat/Pharynx:  lips and gingiva without lesion; tongue and uvula midline; non-inflamed pharynx; no exudates or postnasal drainage Neck: neck supple without adenopathy, thyromegaly, or masses Lungs:  clear to auscultation, breath sounds equal bilaterally, no respiratory distress Cardio:  regular rate and rhythm without murmurs, heart sounds without clicks or rubs Abdomen:  abdomen soft, nontender; bowel sounds normal; no masses or organomegaly Genital (male): circumcised penis, no lesions or discharge; testes present bilaterally without masses or tenderness Rectal: Deferred Musculoskeletal:  symmetrical muscle groups noted without atrophy or deformity Extremities:  no clubbing, cyanosis, or edema, no deformities, no skin discoloration Neuro:  gait normal; deep tendon reflexes normal and symmetric Psych: well oriented with normal range of affect and appropriate judgment/insight  Assessment and Plan  Well adult exam  Hyperlipidemia, unspecified hyperlipidemia type - Plan: Lipid panel  History  of hyperglycemia - Plan: Hemoglobin A1c  Overweight (BMI 25.0-29.9) - Plan: Comprehensive metabolic panel   Well 51 y.o. male. Counseled on diet and exercise. Needs to exercise more frequently. Immunizations, labs, and further orders as above. Explained  that he does not need to take a baby ASA daily based on currently guidelines. Follow up in 1 year pending the above workup. The patient voiced understanding and agreement to the plan.  Jilda Roche [place], DO 11/27/16 3:14 PM

## 2016-11-28 LAB — COMPREHENSIVE METABOLIC PANEL
ALK PHOS: 61 U/L (ref 39–117)
ALT: 32 U/L (ref 0–53)
AST: 17 U/L (ref 0–37)
Albumin: 4.8 g/dL (ref 3.5–5.2)
BUN: 15 mg/dL (ref 6–23)
CHLORIDE: 101 meq/L (ref 96–112)
CO2: 34 mEq/L — ABNORMAL HIGH (ref 19–32)
Calcium: 10.1 mg/dL (ref 8.4–10.5)
Creatinine, Ser: 1.01 mg/dL (ref 0.40–1.50)
GFR: 83.73 mL/min (ref >60.00)
GLUCOSE: 98 mg/dL (ref 70–99)
POTASSIUM: 4.2 meq/L (ref 3.5–5.1)
SODIUM: 140 meq/L (ref 135–145)
Total Bilirubin: 0.4 mg/dL (ref 0.2–1.2)
Total Protein: 7.6 g/dL (ref 6.0–8.3)

## 2016-11-28 LAB — LIPID PANEL
CHOL/HDL RATIO: 7
Cholesterol: 218 mg/dL — ABNORMAL HIGH (ref 0–200)
HDL: 29.1 mg/dL — AB (ref >39.00)
Triglycerides: 504 mg/dL — ABNORMAL HIGH (ref 0.0–149.0)

## 2016-11-28 LAB — LDL CHOLESTEROL, DIRECT: Direct LDL: 129 mg/dL

## 2016-11-28 LAB — HEMOGLOBIN A1C: Hgb A1c MFr Bld: 5.3 % (ref 4.6–6.5)

## 2016-12-02 ENCOUNTER — Telehealth: Payer: Self-pay | Admitting: *Deleted

## 2016-12-02 DIAGNOSIS — E7800 Pure hypercholesterolemia, unspecified: Secondary | ICD-10-CM

## 2016-12-02 NOTE — Telephone Encounter (Signed)
-----   Message from Sharlene DoryNicholas Paul Wendling, DO sent at 11/28/2016  4:30 PM EST ----- Most labs look good, one level on cholesterol came back pretty high so we need to repeat at patient's earliest convenience.  TL- let pt know above. Also order and schedule lipid panel. I want him fasting for 12 hrs, only water prior to coming in. TY.

## 2016-12-02 NOTE — Telephone Encounter (Signed)
Called and spoke with the pt and informed him of recent lab result and note.  Pt verbalized understanding and agreed.  Pt was scheduled a lab appt for (Tues.12/03/16 @ 1:30pm).  Future lab ordered and sent.  Pt stated that he may come in sooner for the appt.//AB/CMA

## 2016-12-03 ENCOUNTER — Other Ambulatory Visit (INDEPENDENT_AMBULATORY_CARE_PROVIDER_SITE_OTHER): Payer: 59

## 2016-12-03 DIAGNOSIS — E7800 Pure hypercholesterolemia, unspecified: Secondary | ICD-10-CM

## 2016-12-03 LAB — LIPID PANEL
CHOL/HDL RATIO: 7
Cholesterol: 203 mg/dL — ABNORMAL HIGH (ref 0–200)
HDL: 30.4 mg/dL — ABNORMAL LOW (ref >39.00)
NonHDL: 172.27
Triglycerides: 288 mg/dL — ABNORMAL HIGH (ref 0.0–149.0)
VLDL: 57.6 mg/dL — ABNORMAL HIGH (ref 0.0–40.0)

## 2016-12-03 LAB — LDL CHOLESTEROL, DIRECT: LDL DIRECT: 119 mg/dL

## 2016-12-11 ENCOUNTER — Ambulatory Visit (INDEPENDENT_AMBULATORY_CARE_PROVIDER_SITE_OTHER): Payer: 59 | Admitting: Family Medicine

## 2016-12-11 VITALS — BP 118/74 | HR 72 | Temp 97.6°F | Resp 14 | Ht 72.0 in | Wt 210.0 lb

## 2016-12-11 DIAGNOSIS — E781 Pure hyperglyceridemia: Secondary | ICD-10-CM

## 2016-12-11 MED ORDER — ATORVASTATIN CALCIUM 20 MG PO TABS: 20.0000 mg | ORAL_TABLET | Freq: Every day | ORAL | 2 refills | Status: DC

## 2016-12-11 NOTE — Progress Notes (Signed)
Chief Complaint  Patient presents with  . Hyperlipidemia    Subjective: Patient is a 51 y.o. male here for f/u cholesterol.  Pt had labs done that showed elevated TG's and low HDL. He is not on any medication. He does not exercise and is not adhering to a healthy diet. He does have a +famhx of elevated cholesterol. No CP or SOB.  ROS: Heart: Denies chest pain Lungs: Denies SOB   Family History  Problem Relation Age of Onset  . Diabetes Father   . Heart attack Father [id number]  . Lung cancer Paternal Uncle   . Stroke Mother [phone]  . Hyperlipidemia Sister    Past Medical History:  Diagnosis Date  . Hyperlipidemia    No Known Allergies  Current Outpatient Prescriptions:  .  aspirin EC 81 MG tablet, Take 81 mg by mouth daily., Disp: , Rfl:  .  atorvastatin (LIPITOR) 20 MG tablet, Take 1 tablet (20 mg total) by mouth daily., Disp: 30 tablet, Rfl: 2  Objective: BP 118/74 (BP Location: Left Arm, Patient Position: Sitting, Cuff Size: Normal)   Pulse 72   Temp 97.6 F (36.4 C) (Oral)   Resp 14   Ht 6' (1.829 m)   Wt 210 lb (95.3 kg)   SpO2 99%   BMI 28.48 kg/m  General: Awake, appears stated age Heart: RRR, no murmurs, no bruits, no LE edema Lungs: CTAB, no rales, wheezes or rhonchi. No accessory muscle use Psych: Age appropriate judgment and insight, normal affect and mood  Assessment and Plan: Hypertriglyceridemia - Plan: atorvastatin (LIPITOR) 20 MG tablet, Lipid Profile, CANCELED: Lipid Profile  Orders as above. Counseled on diet and exercise. F/u in 3 mo. Will have fasting labs done prior to appt. The patient voiced understanding and agreement to the plan.  Jilda Rocheicholas Paul [place]endling, DO 12/11/16  4:57 PM

## 2016-12-11 NOTE — Patient Instructions (Signed)

## 2017-01-13 MED FILL — ATORVASTATIN 20 MG TABLET: 20 | 30 days supply | Qty: 30 | Fill #0

## 2017-02-14 MED FILL — ATORVASTATIN 20 MG TABLET: 20 | 30 days supply | Qty: 30 | Fill #1

## 2017-03-21 ENCOUNTER — Other Ambulatory Visit (INDEPENDENT_AMBULATORY_CARE_PROVIDER_SITE_OTHER): Payer: 59

## 2017-03-21 DIAGNOSIS — E781 Pure hyperglyceridemia: Secondary | ICD-10-CM

## 2017-03-21 LAB — LIPID PANEL
CHOLESTEROL: 126 mg/dL (ref 0–200)
HDL: 28.8 mg/dL — ABNORMAL LOW (ref >39.00)
LDL Cholesterol: 68 mg/dL (ref 0–99)
NonHDL: 96.75
TRIGLYCERIDES: 145 mg/dL (ref 0.0–149.0)
Total CHOL/HDL Ratio: 4
VLDL: 29 mg/dL (ref 0.0–40.0)

## 2017-03-28 ENCOUNTER — Encounter: Payer: Self-pay | Admitting: Family Medicine

## 2017-03-28 ENCOUNTER — Ambulatory Visit (INDEPENDENT_AMBULATORY_CARE_PROVIDER_SITE_OTHER): Payer: 59 | Admitting: Family Medicine

## 2017-03-28 VITALS — BP 138/68 | HR 70 | Temp 98.0°F | Ht 72.0 in | Wt 219.4 lb

## 2017-03-28 DIAGNOSIS — E781 Pure hyperglyceridemia: Secondary | ICD-10-CM | POA: Diagnosis not present

## 2017-03-28 MED ORDER — ATORVASTATIN CALCIUM 20 MG PO TABS: 20.0000 mg | ORAL_TABLET | Freq: Every day | ORAL | 3 refills | Status: DC

## 2017-03-28 MED FILL — ATORVASTATIN 20 MG TABLET: 20 | 90 days supply | Qty: 90 | Fill #0

## 2017-03-28 NOTE — Progress Notes (Signed)
Chief Complaint  Patient presents with  . Follow-up    3  mos discuss recent blood work    Subjective: Patient is a 51 y.o. male here for f/u for TG's.  Around the middle of February, the patient was found to have elevated triglycerides. Lipitor was started. He is currently on 20 mg daily and tolerating the dose well. He has also been changing his diet for the better. He knows he needs to exercise more frequently. Denies any chest pain or shortness of breath.  ROS: Heart: Denies chest pain  Lungs: Denies SOB   Family History  Problem Relation Age of Onset  . Diabetes Father   . Heart attack Father 7288  . Lung cancer Paternal Uncle   . Stroke Mother 1183  . Hyperlipidemia Sister    Past Medical History:  Diagnosis Date  . Hyperlipidemia    No Known Allergies  Current Outpatient Prescriptions:  .  aspirin EC 81 MG tablet, Take 81 mg by mouth daily., Disp: , Rfl:  .  atorvastatin (LIPITOR) 20 MG tablet, Take 1 tablet (20 mg total) by mouth daily., Disp: 90 tablet, Rfl: 3  Objective: BP 138/68 (BP Location: Left Arm, Patient Position: Sitting, Cuff Size: Normal)   Pulse 70   Temp 98 F (36.7 C) (Oral)   Ht 6' (1.829 m)   Wt 219 lb 6.4 oz (99.5 kg)   SpO2 98%   BMI 29.76 kg/m  General: Awake, appears stated age Heart: RRR, no murmurs, no bruits Lungs: CTAB, no rales, wheezes or rhonchi. No accessory muscle use Psych: Age appropriate judgment and insight, normal affect and mood  Assessment and Plan: Hypertriglyceridemia - Plan: atorvastatin (LIPITOR) 20 MG tablet, Lipid panel  Cont Lipitor 20 mg, recheck in 6 mo. Labs before visit. The patient voiced understanding and agreement to the plan.  Jilda Rocheicholas Paul [place], DO 03/28/17  12:43 PM

## 2017-03-28 NOTE — Patient Instructions (Signed)
Come to lab fasting. We will see you for appt after results come back.   Let us know if you need anything.

## 2017-10-24 ENCOUNTER — Ambulatory Visit (INDEPENDENT_AMBULATORY_CARE_PROVIDER_SITE_OTHER): Payer: 59 | Admitting: Family Medicine

## 2017-10-24 ENCOUNTER — Encounter: Payer: Self-pay | Admitting: Family Medicine

## 2017-10-24 ENCOUNTER — Other Ambulatory Visit: Payer: Self-pay

## 2017-10-24 VITALS — BP 132/82 | HR 99 | Temp 98.6°F | Ht 72.0 in | Wt 223.0 lb

## 2017-10-24 DIAGNOSIS — R509 Fever, unspecified: Secondary | ICD-10-CM

## 2017-10-24 DIAGNOSIS — B349 Viral infection, unspecified: Secondary | ICD-10-CM | POA: Diagnosis not present

## 2017-10-24 LAB — POC INFLUENZA A&B (BINAX/QUICKVUE)
Influenza A, POC: NEGATIVE
Influenza B, POC: NEGATIVE

## 2017-10-24 NOTE — Patient Instructions (Addendum)
     IF you received an x-ray today, you will receive an invoice from [facility]. Please contact [facility] at [phone] with questions or concerns regarding your invoice.   IF you received labwork today, you will receive an invoice from [facility]. Please contact [facility] at [phone] with questions or concerns regarding your invoice.   Our billing staff will not be able to assist you with questions regarding bills from these companies.  You will be contacted with the lab results as soon as they are available. The fastest way to get your results is to activate your My Chart account. Instructions are located on the last page of this paperwork. If you have not heard from us regarding the results in 2 weeks, please contact this office.     Upper Respiratory Infection, Adult Most upper respiratory infections (URIs) are caused by a virus. A URI affects the nose, throat, and upper air passages. The most common type of URI is often called "the common cold." Follow these instructions at home:  Take medicines only as told by your doctor.  Gargle warm saltwater or take cough drops to comfort your throat as told by your doctor.  Use a warm mist humidifier or inhale steam from a shower to increase air moisture. This may make it easier to breathe.  Drink enough fluid to keep your pee (urine) clear or pale yellow.  Eat soups and other clear broths.  Have a healthy diet.  Rest as needed.  Go back to work when your fever is gone or your doctor says it is okay. ? You may need to stay home longer to avoid giving your URI to others. ? You can also wear a face mask and wash your hands often to prevent spread of the virus.  Use your inhaler more if you have asthma.  Do not use any tobacco products, including cigarettes, chewing tobacco, or electronic cigarettes. If you need help quitting, ask your doctor. Contact a doctor if:  You are getting worse, not better.  Your  symptoms are not helped by medicine.  You have chills.  You are getting more short of breath.  You have brown or red mucus.  You have yellow or brown discharge from your nose.  You have pain in your face, especially when you bend forward.  You have a fever.  You have puffy (swollen) neck glands.  You have pain while swallowing.  You have white areas in the back of your throat. Get help right away if:  You have very bad or constant: ? Headache. ? Ear pain. ? Pain in your forehead, behind your eyes, and over your cheekbones (sinus pain). ? Chest pain.  You have long-lasting (chronic) lung disease and any of the following: ? Wheezing. ? Long-lasting cough. ? Coughing up blood. ? A change in your usual mucus.  You have a stiff neck.  You have changes in your: ? Vision. ? Hearing. ? Thinking. ? Mood. This information is not intended to replace advice given to you by your health care provider. Make sure you discuss any questions you have with your health care provider. Document Released: 04/01/2008 Document Revised: 06/16/2016 Document Reviewed: 01/19/2014 Elsevier Interactive Patient Education  2018 Elsevier Inc.  

## 2017-10-24 NOTE — Progress Notes (Signed)
12/28/201811:51 AM  Jason Benson 03-15-1966, 51 y.o. male [id number]  Chief Complaint  Patient presents with  . Nasal Congestion    aching joints, chills, some fever. taking tylenol    HPI:   Patient is a 51 y.o. male with past medical history significant for hypertriglycedemia who presents today for 2 days of low grade fever, chills, body aches, achy joints. Denies cough, sob, nausea, vomiting, diarrhea or rash. Has not had this season's flu vaccine.   Depression screen [facility] 2/9 10/24/2017 03/28/2017  Decreased Interest 0 0  Down, Depressed, Hopeless 0 0  PHQ - 2 Score 0 0  Altered sleeping - 0  Tired, decreased energy - 0  Change in appetite - 0  Feeling bad or failure about yourself  - 0  Trouble concentrating - 0  Moving slowly or fidgety/restless - 0  Suicidal thoughts - 0  PHQ-9 Score - 0    No Known Allergies  Prior to Admission medications   Medication Sig Start Date End Date Taking? Authorizing Provider  aspirin EC 81 MG tablet Take 81 mg by mouth daily.   Yes [provider]  atorvastatin (LIPITOR) 20 MG tablet Take 1 tablet (20 mg total) by mouth daily. Patient not taking: Reported on 10/24/2017 03/28/17   Sharlene DoryWendling, Nicholas Paul, DO    Past Medical History:  Diagnosis Date  . Hyperlipidemia     Past Surgical History:  Procedure Laterality Date  . VASECTOMY    . WISDOM TOOTH EXTRACTION      Social History   Tobacco Use  . Smoking status: Never Smoker  . Smokeless tobacco: Never Used  Substance Use Topics  . Alcohol use: No    Family History  Problem Relation Age of Onset  . Diabetes Father   . Heart attack Father 7688  . Lung cancer Paternal Uncle   . Stroke Mother 4983  . Hyperlipidemia Sister     ROS Per hpi  OBJECTIVE:  Blood pressure 132/82, pulse 99, temperature 98.6 F (37 C), temperature source Oral, height 6' (1.829 m), weight 223 lb (101.2 kg), SpO2 96 %.  Physical Exam  Constitutional: He is oriented to person,  place, and time and well-developed, well-nourished, and in no distress.  HENT:  Head: Normocephalic and atraumatic.  Right Ear: Hearing, tympanic membrane, external ear and ear canal normal.  Left Ear: Hearing, tympanic membrane, external ear and ear canal normal.  Mouth/Throat: Oropharynx is clear and moist. No oropharyngeal exudate.  Eyes: Conjunctivae and EOM are normal. Pupils are equal, round, and reactive to light.  Neck: Neck supple.  Cardiovascular: Normal rate and regular rhythm. Exam reveals no gallop and no friction rub.  No murmur heard. Pulmonary/Chest: Effort normal and breath sounds normal. He has no wheezes. He has no rales.  Lymphadenopathy:    He has no cervical adenopathy.  Neurological: He is alert and oriented to person, place, and time. Gait normal.  Skin: Skin is warm and dry.    Results for orders placed or performed in visit on 10/24/17 (from the past 24 hour(s))  POC Influenza A&B(BINAX/QUICKVUE)     Status: None   Collection Time: 10/24/17 12:03 PM  Result Value Ref Range   Influenza A, POC Negative Negative   Influenza B, POC Negative Negative     ASSESSMENT and PLAN  1. Viral illness 2. Fever, unspecified - POC Influenza A&B(BINAX/QUICKVUE)  Discussed supportive measures including increase hydration, rest, OTC medications, etc. RTC precautions discussed.  Return if symptoms worsen  or fail to improve.    Rutherford Guys, MD Primary Care at [facility] [place], [place] [address] Ph.  [phone] Fax [phone]

## 2017-10-25 ENCOUNTER — Encounter: Payer: Self-pay | Admitting: Family Medicine

## 2017-11-06 ENCOUNTER — Telehealth: Payer: Self-pay

## 2017-11-06 NOTE — Telephone Encounter (Signed)
Copied from CRM [id number]. Topic: Inquiry >> Oct 23, 2017 11:01 AM Windy KalataMichael, Taylor L, NT wrote: Reason for CRM: patient states he is closer to this location [place]( [place] ) and would like to become a new patient of Dr. Doreene BurkeKremer since it is closer to him. He is currently a patient of Dr. Carmelia RollerWendling ([place]) please advise and call and let patient know if this transition is okay.  >> Nov 06, 2017  2:45 PM Marcha SoldersWalker, Monica D wrote: See patient request. Please advise.

## 2017-11-06 NOTE — Telephone Encounter (Signed)
OK w me.  

## 2017-11-06 NOTE — Telephone Encounter (Signed)
See below and advise. Thank you  

## 2017-11-11 MED FILL — ATORVASTATIN 20 MG TABLET: 20 | 90 days supply | Qty: 90 | Fill #1

## 2018-01-23 ENCOUNTER — Encounter: Payer: 59 | Admitting: Family Medicine

## 2018-02-12 ENCOUNTER — Ambulatory Visit (INDEPENDENT_AMBULATORY_CARE_PROVIDER_SITE_OTHER): Payer: 59 | Admitting: Family Medicine

## 2018-02-12 ENCOUNTER — Encounter: Payer: Self-pay | Admitting: Family Medicine

## 2018-02-12 VITALS — BP 130/80 | HR 76 | Ht 72.0 in | Wt 217.5 lb

## 2018-02-12 DIAGNOSIS — Z0000 Encounter for general adult medical examination without abnormal findings: Secondary | ICD-10-CM

## 2018-02-12 DIAGNOSIS — Z125 Encounter for screening for malignant neoplasm of prostate: Secondary | ICD-10-CM

## 2018-02-12 LAB — LIPID PANEL
CHOL/HDL RATIO: 7
Cholesterol: 201 mg/dL — ABNORMAL HIGH (ref 0–200)
HDL: 30.8 mg/dL — ABNORMAL LOW (ref >39.00)
LDL Cholesterol: 130 mg/dL — ABNORMAL HIGH (ref 0–99)
NONHDL: 169.99
Triglycerides: 198 mg/dL — ABNORMAL HIGH (ref 0.0–149.0)
VLDL: 39.6 mg/dL (ref 0.0–40.0)

## 2018-02-12 LAB — COMPREHENSIVE METABOLIC PANEL
ALK PHOS: 61 U/L (ref 39–117)
ALT: 28 U/L (ref 0–53)
AST: 17 U/L (ref 0–37)
Albumin: 4.5 g/dL (ref 3.5–5.2)
BILIRUBIN TOTAL: 0.5 mg/dL (ref 0.2–1.2)
BUN: 14 mg/dL (ref 6–23)
CO2: 28 meq/L (ref 19–32)
Calcium: 9.5 mg/dL (ref 8.4–10.5)
Chloride: 103 mEq/L (ref 96–112)
Creatinine, Ser: 0.91 mg/dL (ref 0.40–1.50)
GFR: 93.96 mL/min (ref >60.00)
GLUCOSE: 98 mg/dL (ref 70–99)
POTASSIUM: 4.2 meq/L (ref 3.5–5.1)
SODIUM: 138 meq/L (ref 135–145)
TOTAL PROTEIN: 7 g/dL (ref 6.0–8.3)

## 2018-02-12 LAB — CBC
HCT: 46 % (ref 39.0–52.0)
Hemoglobin: 16.2 g/dL (ref 13.0–17.0)
MCHC: 35.1 g/dL (ref 30.0–36.0)
MCV: 91.6 fl (ref 78.0–100.0)
Platelets: 213 10*3/uL (ref 150.0–400.0)
RBC: 5.03 Mil/uL (ref 4.22–5.81)
RDW: 12.9 % (ref 11.5–15.5)
WBC: 5.2 10*3/uL (ref 4.0–10.5)

## 2018-02-12 LAB — URINALYSIS, ROUTINE W REFLEX MICROSCOPIC
BILIRUBIN URINE: NEGATIVE
Ketones, ur: NEGATIVE
Leukocytes, UA: NEGATIVE
NITRITE: NEGATIVE
PH: 6 (ref 5.0–8.0)
Specific Gravity, Urine: 1.02 (ref 1.000–1.030)
Total Protein, Urine: NEGATIVE
Urine Glucose: NEGATIVE
Urobilinogen, UA: 0.2 (ref 0.0–1.0)

## 2018-02-12 LAB — PSA: PSA: 1.64 ng/mL (ref 0.10–4.00)

## 2018-02-12 LAB — TSH: TSH: 0.99 u[IU]/mL (ref 0.35–4.50)

## 2018-02-12 NOTE — Progress Notes (Signed)
Subjective:  [name], male    DOB: November 16, 1965  Age: 52 y.o. MRN: [id number]  CC: Annual Exam   HPI Jason Benson presents for a physical exam.  He is fasting this morning and is in good health as far as he knows.  He has a past medical history of elevated cholesterol that has been treated in the past with Lipitor.  He feels as though this medication caused him to be depressed and he stopped it.  He has had no Lipitor for 3 months now.  He lives with his wife and 3 teenage children.  He works long hours [employer].  He is not currently exercising.  He does not smoke or use illicit drugs.  His parents lived until age 73 and 69.  His father had elevated cholesterol and diabetes.  He has a sister with hyperlipidemia.  His urine flow is good and he is having no changes in his bowel habits.  History Jason Benson has a past medical history of Hyperlipidemia.   He has a past surgical history that includes Wisdom tooth extraction and Vasectomy.   His family history includes Diabetes in his father; Heart attack (age of onset: 49) in his father; Hyperlipidemia in his sister; Lung cancer in his paternal uncle; Stroke (age of onset: 76) in his mother.He reports that he has never smoked. He has never used smokeless tobacco. He reports that he does not drink alcohol or use drugs.  Outpatient Medications Prior to Visit  Medication Sig Dispense Refill  . aspirin EC 81 MG tablet Take 81 mg by mouth daily.    Marland Kitchen atorvastatin (LIPITOR) 20 MG tablet Take 1 tablet (20 mg total) by mouth daily. 90 tablet 3   No facility-administered medications prior to visit.     ROS Review of Systems  Constitutional: Negative for chills, fatigue, fever and unexpected weight change.  HENT: Negative.   Eyes: Negative for photophobia and visual disturbance.  Respiratory: Negative for cough and wheezing.   Cardiovascular: Negative.   Gastrointestinal: Negative.  Negative for anal bleeding, blood in  stool and vomiting.  Endocrine: Negative for polyphagia and polyuria.  Genitourinary: Negative for decreased urine volume, difficulty urinating, hematuria and urgency.  Musculoskeletal: Negative.   Skin: Negative for pallor and rash.  Allergic/Immunologic: Negative for immunocompromised state.  Neurological: Negative for weakness and headaches.  Psychiatric/Behavioral: Negative.     Objective:  BP 130/80 (BP Location: Right Arm, Patient Position: Sitting, Cuff Size: Normal)   Pulse 76   Ht 6' (1.829 m)   Wt 217 lb 8 oz (98.7 kg)   SpO2 98%   BMI 29.50 kg/m   Physical Exam  Constitutional: He is oriented to person, place, and time. He appears well-developed and well-nourished. No distress.  HENT:  Head: Normocephalic and atraumatic.  Right Ear: External ear normal.  Left Ear: External ear normal.  Nose: Nose normal.  Mouth/Throat: Oropharynx is clear and moist. No oropharyngeal exudate.  Eyes: Pupils are equal, round, and reactive to light. Conjunctivae and EOM are normal. Right eye exhibits no discharge. Left eye exhibits no discharge. No scleral icterus.  Neck: Normal range of motion. Neck supple. No JVD present. No tracheal deviation present. No thyromegaly present.  Cardiovascular: Normal rate, regular rhythm and normal heart sounds.  Pulmonary/Chest: Effort normal and breath sounds normal.  Abdominal: Soft. Bowel sounds are normal. He exhibits no distension and no mass. There is no tenderness. There is no guarding. Hernia confirmed negative in the  right inguinal area and confirmed negative in the left inguinal area.  Genitourinary: Testes normal and penis normal. Right testis shows no mass, no swelling and no tenderness. Right testis is descended. Left testis shows no mass, no swelling and no tenderness. Left testis is descended. Circumcised. No hypospadias, penile erythema or penile tenderness. No discharge found.  Musculoskeletal: Normal range of motion. He exhibits no edema.    Lymphadenopathy:    He has no cervical adenopathy. No inguinal adenopathy noted on the right or left side.  Neurological: He is alert and oriented to person, place, and time.  Skin: Skin is warm and dry. Capillary refill takes less than 2 seconds. No rash noted. He is not diaphoretic. No erythema. No pallor.  Psychiatric: He has a normal mood and affect. His behavior is normal.      Assessment & Plan:   [name] was seen today for annual exam.  Diagnoses and all orders for this visit:  Health care maintenance -     CBC -     Comprehensive metabolic panel -     Lipid panel -     PSA -     TSH -     Urinalysis, Routine w reflex microscopic -     HIV antibody   I have discontinued Koal A. Coto's atorvastatin. I am also having him maintain his aspirin EC.  No orders of the defined types were placed in this encounter.  Fasting blood work today.  He was given information about how to increase his exercise and healthy living techniques to lower his cholesterol.  Information was given about health maintenance and prevention of disease.  He will turn 52 towards the end of the year discussed need for colonoscopy at that time and a prostate exam.  Follow-up: Return recommended follow up pends results of blood work.  Jason SaxWilliam Alfred Kremer, MD

## 2018-02-12 NOTE — Patient Instructions (Signed)
Health Maintenance, Male A healthy lifestyle and preventive care is important for your health and wellness. Ask your health care provider about what schedule of regular examinations is right for you. What should I know about weight and diet? Eat a Healthy Diet  Eat plenty of vegetables, fruits, whole grains, low-fat dairy products, and lean protein.  Do not eat a lot of foods high in solid fats, added sugars, or salt.  Maintain a Healthy Weight Regular exercise can help you achieve or maintain a healthy weight. You should:  Do at least 150 minutes of exercise each week. The exercise should increase your heart rate and make you sweat (moderate-intensity exercise).  Do strength-training exercises at least twice a week.  Watch Your Levels of Cholesterol and Blood Lipids  Have your blood tested for lipids and cholesterol every 5 years starting at 50 years of age. If you are at high risk for heart disease, you should start having your blood tested when you are 50 years old. You may need to have your cholesterol levels checked more often if: ? Your lipid or cholesterol levels are high. ? You are older than 50 years of age. ? You are at high risk for heart disease.  What should I know about cancer screening? Many types of cancers can be detected early and may often be prevented. Lung Cancer  You should be screened every year for lung cancer if: ? You are a current smoker who has smoked for at least 30 years. ? You are a former smoker who has quit within the past 15 years.  Talk to your health care provider about your screening options, when you should start screening, and how often you should be screened.  Colorectal Cancer  Routine colorectal cancer screening usually begins at 50 years of age and should be repeated every 5-10 years until you are 50 years old. You may need to be screened more often if early forms of precancerous polyps or small growths are found. Your health care provider  may recommend screening at an earlier age if you have risk factors for colon cancer.  Your health care provider may recommend using home test kits to check for hidden blood in the stool.  A small camera at the end of a tube can be used to examine your colon (sigmoidoscopy or colonoscopy). This checks for the earliest forms of colorectal cancer.  Prostate and Testicular Cancer  Depending on your age and overall health, your health care provider may do certain tests to screen for prostate and testicular cancer.  Talk to your health care provider about any symptoms or concerns you have about testicular or prostate cancer.  Skin Cancer  Check your skin from head to toe regularly.  Tell your health care provider about any new moles or changes in moles, especially if: ? There is a change in a mole's size, shape, or color. ? You have a mole that is larger than a pencil eraser.  Always use sunscreen. Apply sunscreen liberally and repeat throughout the day.  Protect yourself by wearing long sleeves, pants, a wide-brimmed hat, and sunglasses when outside.  What should I know about heart disease, diabetes, and high blood pressure?  If you are 89-13 years of age, have your blood pressure checked every 3-5 years. If you are 70 years of age or older, have your blood pressure checked every year. You should have your blood pressure measured twice-once when you are at a hospital or clinic, and once when  you are not at a hospital or clinic. Record the average of the two measurements. To check your blood pressure when you are not at a hospital or clinic, you can use: ? An automated blood pressure machine at a pharmacy. ? A home blood pressure monitor.  Talk to your health care provider about your target blood pressure.  If you are between 18-11 years old, ask your health care provider if you should take aspirin to prevent heart disease.  Have regular diabetes screenings by checking your fasting blood  sugar level. ? If you are at a normal weight and have a low risk for diabetes, have this test once every three years after the age of 70. ? If you are overweight and have a high risk for diabetes, consider being tested at a younger age or more often.  A one-time screening for abdominal aortic aneurysm (AAA) by ultrasound is recommended for men aged 65-75 years who are current or former smokers. What should I know about preventing infection? Hepatitis B If you have a higher risk for hepatitis B, you should be screened for this virus. Talk with your health care provider to find out if you are at risk for hepatitis B infection. Hepatitis C Blood testing is recommended for:  Everyone born from [birth year] through 1963.  Anyone with known risk factors for hepatitis C.  Sexually Transmitted Diseases (STDs)  You should be screened each year for STDs including gonorrhea and chlamydia if: ? You are sexually active and are younger than 50 years of age. ? You are older than 50 years of age and your health care provider tells you that you are at risk for this type of infection. ? Your sexual activity has changed since you were last screened and you are at an increased risk for chlamydia or gonorrhea. Ask your health care provider if you are at risk.  Talk with your health care provider about whether you are at high risk of being infected with HIV. Your health care provider may recommend a prescription medicine to help prevent HIV infection.  What else can I do?  Schedule regular health, dental, and eye exams.  Stay current with your vaccines (immunizations).  Do not use any tobacco products, such as cigarettes, chewing tobacco, and e-cigarettes. If you need help quitting, ask your health care provider.  Limit alcohol intake to no more than 2 drinks per day. One drink equals 12 ounces of beer, 5 ounces of wine, or 1 ounces of hard liquor.  Do not use street drugs.  Do not share needles.  Ask your  health care provider for help if you need support or information about quitting drugs.  Tell your health care provider if you often feel depressed.  Tell your health care provider if you have ever been abused or do not feel safe at home. This information is not intended to replace advice given to you by your health care provider. Make sure you discuss any questions you have with your health care provider. Document Released: 04/11/2008 Document Revised: 06/12/2016 Document Reviewed: 07/18/2015 Elsevier Interactive Patient Education  2018 [employer].  How to Increase Your Level of Physical Activity Getting regular physical activity is important for your overall health and well-being. Most people do not get enough exercise. There are easy ways to increase your level of physical activity, even if you have not been very active in the past or you are just starting out. Why is physical activity important? Physical activity has many  short-term and long-term health benefits. Regular exercise can:  Help you lose weight or maintain a healthy weight.  Strengthen your muscles and bones.  Boost your mood and improve self-esteem.  Reduce your risk of certain long-term (chronic) diseases, like heart disease, cancer, and diabetes.  Help you stay capable of walking and moving around (mobile) as you age.  Prevent accidents, such as falls, as you age.  Increase life expectancy.  What are the benefits of being physically active on a regular basis? In addition to improving your physical health, being physically active on most days of the week can help you in ways that you may not expect. Benefits of regular physical activity may include:  Feeling good about your body.  Being able to move around more easily and for longer periods of time without getting tired (increased stamina).  Finding new sources of fun and enjoyment.  Meeting new people who share a common interest.  Being able to fight off  illness better (enhanced immunity).  Being able to sleep better.  What can happen if I am not physically active on a regular basis? Not getting enough physical activity can lead to an unhealthy lifestyle and future health problems. This can increase your chances of:  Becoming overweight or obese.  Becoming sick.  Developing chronic illnesses, like heart disease or diabetes.  Having mental health problems, like depression or anxiety.  Having sleep problems.  Having trouble walking or getting yourself around (reduced mobility).  Injuring yourself in a fall as you get older.  What steps can I take to be more physically active?  Check with your health care provider about how to get started. Ask your health care provider what activities are safe for you.  Start out slowly. Walking or doing some simple chair exercises is a good place to start, especially if you have not been active before or for a long time.  Try to find activities that you enjoy. You are more likely to commit to an exercise routine if it does not feel like a chore.  If you have bone or joint problems, choose low-impact exercises, like walking or swimming.  Include physical activity in your everyday routine.  Invite friends or family members to exercise with you. This also will help you commit to your workout plan.  Set goals that you can work toward.  Aim for at least 150 minutes of moderate-intensity exercise each week. Examples of moderate-intensity exercise include walking or riding a bike. Where to find more information:  Centers for Disease Control and Prevention: BowlingGrip.is  President's Council on [employer], Sports & Nutrition www.http://villegas.org/  ChooseMyPlate: WirelessMortgages.dk Contact a health care provider if:  You have headaches, muscle aches, or joint pain.  You feel dizzy or light-headed while exercising.  You faint.  You have  chest pain while exercising. Summary  Exercise benefits your mind and body at any age, even if you are just starting out.  If you have a chronic illness or have not been active for a while, check with your health care provider before increasing your physical activity.  Choose activities that are safe and enjoyable for you.Ask your health care provider what activities are safe for you.  Start slowly. Tell your health care provider if you have problems as you start to increase your activity level. This information is not intended to replace advice given to you by your health care provider. Make sure you discuss any questions you have with your health care provider. Document Released:  10/03/2016 Document Revised: 10/03/2016 Document Reviewed: 10/03/2016 Elsevier Interactive Patient Education  2018 [place].  Preventing High Cholesterol Cholesterol is a waxy, fat-like substance that your body needs in small amounts. Your liver makes all the cholesterol that your body needs. Having high cholesterol (hypercholesterolemia) increases your risk for heart disease and stroke. Extra (excess) cholesterol comes from the food you eat, such as animal-based fat (saturated fat) from meat and some dairy products. High cholesterol can often be prevented with diet and lifestyle changes. If you already have high cholesterol, you can control it with diet and lifestyle changes, as well as medicine. What nutrition changes can be made?  Eat less saturated fat. Foods that contain saturated fat include red meat and some dairy products.  Avoid processed meats, like bacon and lunch meats.  Avoid trans fats, which are found in margarine and some baked goods.  Avoid foods and beverages that have added sugars.  Eat more fruits, vegetables, and whole grains.  Choose healthy sources of protein, such as fish, poultry, and nuts.  Choose healthy sources of fat, such as: ? Nuts. ? Vegetable oils, especially olive  oil. ? Fish that have healthy fats (omega-3 fatty acids), such as mackerel or salmon. What lifestyle changes can be made?  Lose weight if you are overweight. Losing 5-10 lb (2.3-4.5 kg) can help prevent or control high cholesterol and reduce your risk for diabetes and high blood pressure. Ask your health care provider to help you with a diet and exercise plan to safely lose weight.  Get enough exercise. Do at least 150 minutes of moderate-intensity exercise each week. ? You could do this in short exercise sessions several times a day, or you could do longer exercise sessions a few times a week. For example, you could take a brisk 10-minute walk or bike ride, 3 times a day, for 5 days a week.  Do not smoke. If you need help quitting, ask your health care provider.  Limit your alcohol intake. If you drink alcohol, limit alcohol intake to no more than 1 drink a day for nonpregnant women and 2 drinks a day for men. One drink equals 12 oz of beer, 5 oz of wine, or 1 oz of hard liquor. Why are these changes important? If you have high cholesterol, deposits (plaques) may build up on the walls of your blood vessels. Plaques make the arteries narrower and stiffer, which can restrict or block blood flow and cause blood clots to form. This greatly increases your risk for heart attack and stroke. Making diet and lifestyle changes can reduce your risk for these life-threatening conditions. What can I do to lower my risk?  Manage your risk factors for high cholesterol. Talk with your health care provider about all of your risk factors and how to lower your risk.  Manage other conditions that you have, such as diabetes or high blood pressure (hypertension).  Have your cholesterol checked at regular intervals.  Keep all follow-up visits as told by your health care provider. This is important. How is this treated? In addition to diet and lifestyle changes, your health care provider may recommend medicines to  help lower cholesterol, such as a medicine to reduce the amount of cholesterol made in your liver. You may need medicine if:  Diet and lifestyle changes do not lower your cholesterol enough.  You have high cholesterol and other risk factors for heart disease or stroke.  Take over-the-counter and prescription medicines only as told by your health  care provider. Where to find more information:  American Heart Association: ThisTune.com.pt.jsp  National Heart, Lung, and Blood Institute: FrenchToiletries.com.cy Summary  High cholesterol increases your risk for heart disease and stroke. By keeping your cholesterol level low, you can reduce your risk for these conditions.  Diet and lifestyle changes are the most important steps in preventing high cholesterol.  Work with your health care provider to manage your risk factors, and have your blood tested regularly. This information is not intended to replace advice given to you by your health care provider. Make sure you discuss any questions you have with your health care provider. Document Released: 10/29/2015 Document Revised: 06/22/2016 Document Reviewed: 06/22/2016 Elsevier Interactive Patient Education  2018 [place] Years, Male Preventive care refers to lifestyle choices and visits with your health care provider that can promote health and wellness. What does preventive care include?  A yearly physical exam. This is also called an annual well check.  Dental exams once or twice a year.  Routine eye exams. Ask your health care provider how often you should have your eyes checked.  Personal lifestyle choices, including: ? Daily care of your teeth and gums. ? Regular physical activity. ? Eating a healthy diet. ? Avoiding tobacco and drug use. ? Limiting alcohol use. ? Practicing safe  sex. ? Taking low-dose aspirin every day starting at age 53. What happens during an annual well check? The services and screenings done by your health care provider during your annual well check will depend on your age, overall health, lifestyle risk factors, and family history of disease. Counseling Your health care provider may ask you questions about your:  Alcohol use.  Tobacco use.  Drug use.  Emotional well-being.  Home and relationship well-being.  Sexual activity.  Eating habits.  Work and work Statistician.  Screening You may have the following tests or measurements:  Height, weight, and BMI.  Blood pressure.  Lipid and cholesterol levels. These may be checked every 5 years, or more frequently if you are over 51 years old.  Skin check.  Lung cancer screening. You may have this screening every year starting at age 37 if you have a 30-pack-year history of smoking and currently smoke or have quit within the past 15 years.  Fecal occult blood test (FOBT) of the stool. You may have this test every year starting at age 46.  Flexible sigmoidoscopy or colonoscopy. You may have a sigmoidoscopy every 5 years or a colonoscopy every 10 years starting at age 20.  Prostate cancer screening. Recommendations will vary depending on your family history and other risks.  Hepatitis C blood test.  Hepatitis B blood test.  Sexually transmitted disease (STD) testing.  Diabetes screening. This is done by checking your blood sugar (glucose) after you have not eaten for a while (fasting). You may have this done every 1-3 years.  Discuss your test results, treatment options, and if necessary, the need for more tests with your health care provider. Vaccines Your health care provider may recommend certain vaccines, such as:  Influenza vaccine. This is recommended every year.  Tetanus, diphtheria, and acellular pertussis (Tdap, Td) vaccine. You may need a Td booster every 10  years.  Varicella vaccine. You may need this if you have not been vaccinated.  Zoster vaccine. You may need this after age 60.  Measles, mumps, and rubella (MMR) vaccine. You may need at least one dose of MMR if you were born in 1957 or later.  You may also need a second dose.  Pneumococcal 13-valent conjugate (PCV13) vaccine. You may need this if you have certain conditions and have not been vaccinated.  Pneumococcal polysaccharide (PPSV23) vaccine. You may need one or two doses if you smoke cigarettes or if you have certain conditions.  Meningococcal vaccine. You may need this if you have certain conditions.  Hepatitis A vaccine. You may need this if you have certain conditions or if you travel or work in places where you may be exposed to hepatitis A.  Hepatitis B vaccine. You may need this if you have certain conditions or if you travel or work in places where you may be exposed to hepatitis B.  Haemophilus influenzae type b (Hib) vaccine. You may need this if you have certain risk factors.  Talk to your health care provider about which screenings and vaccines you need and how often you need them. This information is not intended to replace advice given to you by your health care provider. Make sure you discuss any questions you have with your health care provider. Document Released: 11/10/2015 Document Revised: 07/03/2016 Document Reviewed: 08/15/2015 Elsevier Interactive Patient Education  [employer].

## 2018-02-13 LAB — HIV ANTIBODY (ROUTINE TESTING W REFLEX): HIV 1&2 Ab, 4th Generation: NONREACTIVE

## 2018-04-28 DIAGNOSIS — H524 Presbyopia: Secondary | ICD-10-CM | POA: Diagnosis not present

## 2019-04-14 ENCOUNTER — Encounter: Payer: 59 | Admitting: Family Medicine

## 2019-04-15 ENCOUNTER — Telehealth: Payer: Self-pay

## 2019-04-15 NOTE — Telephone Encounter (Signed)

## 2019-04-16 ENCOUNTER — Encounter: Payer: Self-pay | Admitting: Family Medicine

## 2019-04-16 ENCOUNTER — Ambulatory Visit (INDEPENDENT_AMBULATORY_CARE_PROVIDER_SITE_OTHER): Payer: 59 | Admitting: Family Medicine

## 2019-04-16 VITALS — BP 126/78 | HR 75 | Ht 72.0 in | Wt 215.0 lb

## 2019-04-16 DIAGNOSIS — Z0000 Encounter for general adult medical examination without abnormal findings: Secondary | ICD-10-CM | POA: Diagnosis not present

## 2019-04-16 NOTE — Patient Instructions (Signed)

## 2019-04-16 NOTE — Progress Notes (Addendum)
Established Patient Office Visit  Subjective:  [name], male    DOB: [birth year]  Age: 53 y.o. MRN: [id number]  CC:  Chief Complaint  Patient presents with  . Annual Exam    HPI Jason Benson presents for a complete physical.  Jason Benson turned 52 this past November.  Continues to do well.  Jason Benson lives with his wife.  His 3 children are grown in college.  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  Denies any changes in his bowel or bladder habits.  Jason Benson is seeing no blood in his stool.  Jason Benson drinks cold water throughout the day.  Nocturia x1 or 2.  Denies any decrease in force of his stream or pre-or post void dribbling.  Denies any issue with ED at this time.  Jason Benson is taking Lipitor 10 mg twice a week.  The daily dosage had caused him to feel depressed.  No family history of early heart disease.  Jason Benson does not drink alcohol or smoke.   Past Medical History:  Diagnosis Date  . Hyperlipidemia     Past Surgical History:  Procedure Laterality Date  . VASECTOMY    . WISDOM TOOTH EXTRACTION      Family History  Problem Relation Age of Onset  . Diabetes Father   . Heart attack Father [id number]  . Lung cancer Paternal Uncle   . Stroke Mother [id number]  . Hyperlipidemia Sister     Social History   Socioeconomic History  . Marital status: Married    Spouse name: Not on file  . Number of children: Not on file  . Years of education: Not on file  . Highest education level: Not on file  Occupational History  . Not on file  Social Needs  . Financial resource strain: Not on file  . Food insecurity    Worry: Not on file    Inability: Not on file  . Transportation needs    Medical: Not on file    Non-medical: Not on file  Tobacco Use  . Smoking status: Never Smoker  . Smokeless tobacco: Never Used  Substance and Sexual Activity  . Alcohol use: No  . Drug use: No  . Sexual activity: Not on file  Lifestyle  . Physical activity    Days per  week: Not on file    Minutes per session: Not on file  . Stress: Not on file  Relationships  . Social Musicianconnections    Talks on phone: Not on file    Gets together: Not on file    Attends religious service: Not on file    Active member of club or organization: Not on file    Attends meetings of clubs or organizations: Not on file    Relationship status: Not on file  . Intimate partner violence    Fear of current or ex partner: Not on file    Emotionally abused: Not on file    Physically abused: Not on file    Forced sexual activity: Not on file  Other Topics Concern  . Not on file  Social History Narrative  . Not on file    Outpatient Medications Prior to Visit  Medication Sig Dispense Refill  . aspirin EC 81 MG tablet Take 81 mg by mouth daily.    Marland Kitchen. atorvastatin (LIPITOR) 10 MG tablet Take 10 mg by mouth. Patient is taking twice weekly     No facility-administered  medications prior to visit.     No Known Allergies  ROS Review of Systems  Constitutional: Negative for chills, diaphoresis, fatigue, fever and unexpected weight change.  HENT: Positive for postnasal drip and sneezing.   Eyes: Negative for photophobia and visual disturbance.  Respiratory: Negative.  Negative for choking and shortness of breath.   Cardiovascular: Negative.  Negative for chest pain and palpitations.  Gastrointestinal: Negative.  Negative for anal bleeding, blood in stool and constipation.  Endocrine: Negative for polyphagia and polyuria.  Genitourinary: Negative.   Musculoskeletal: Negative for arthralgias and myalgias.  Skin: Negative for pallor and rash.  Allergic/Immunologic: Negative for immunocompromised state.  Neurological: Negative for headaches.  Hematological: Negative.   Psychiatric/Behavioral: Negative.       Objective:    Physical Exam  Constitutional: Jason Benson is oriented to person, place, and time. Jason Benson appears well-developed and well-nourished. No distress.  HENT:  Head:  Normocephalic and atraumatic.  Right Ear: External ear normal.  Left Ear: External ear normal.  Mouth/Throat: Oropharynx is clear and moist. No oropharyngeal exudate.  Eyes: Pupils are equal, round, and reactive to light. Conjunctivae are normal. Right eye exhibits no discharge. Left eye exhibits no discharge. No scleral icterus.  Neck: No JVD present. No tracheal deviation present.  Cardiovascular: Normal rate, regular rhythm and normal heart sounds.  Pulmonary/Chest: Effort normal and breath sounds normal. No stridor.  Abdominal: Bowel sounds are normal.  Genitourinary: Rectum:     Guaiac result negative.     No rectal mass, anal fissure, tenderness, external hemorrhoid, internal hemorrhoid or abnormal anal tone.  Prostate is enlarged. Prostate is not tender.  Neurological: Jason Benson is alert and oriented to person, place, and time.  Skin: Skin is warm and dry. Jason Benson is not diaphoretic.  Psychiatric: Jason Benson has a normal mood and affect. His behavior is normal.  The 10-year ASCVD risk score Denman George(Goff DC Jr., et al., 2013) is: 6.4%   Values used to calculate the score:     Age: [age] years     Sex: Male     Is Non-Hispanic African American: No     Diabetic: No     Tobacco smoker: No     Systolic Blood Pressure: 126 mmHg     Is BP treated: No     HDL Cholesterol: 33 mg/dL     Total Cholesterol: 231 mg/dL  BP 161/09126/78   Pulse 75   Ht 6' (1.829 m)   Wt 215 lb (97.5 kg)   SpO2 98%   BMI 29.16 kg/m  Wt Readings from Last 3 Encounters:  04/16/19 215 lb (97.5 kg)  02/12/18 217 lb 8 oz (98.7 kg)  10/24/17 223 lb (101.2 kg)   BP Readings from Last 3 Encounters:  04/16/19 126/78  02/12/18 130/80  10/24/17 132/82   Guideline developer:  UpToDate (see UpToDate for funding source) Date Released: June 2014  Health Maintenance Due  Topic Date Due  . COLONOSCOPY  09/15/2018    There are no preventive care reminders to display for this patient.  Lab Results  Component Value Date   TSH 0.99  02/12/2018   Lab Results  Component Value Date   WBC 5.2 02/12/2018   HGB 16.2 02/12/2018   HCT 46.0 02/12/2018   MCV 91.6 02/12/2018   PLT 213.0 02/12/2018   Lab Results  Component Value Date   NA 138 02/12/2018   K 4.2 02/12/2018   CO2 28 02/12/2018   GLUCOSE 98 02/12/2018   BUN 14 02/12/2018  CREATININE 0.91 02/12/2018   BILITOT 0.5 02/12/2018   ALKPHOS 61 02/12/2018   AST 17 02/12/2018   ALT 28 02/12/2018   PROT 7.0 02/12/2018   ALBUMIN 4.5 02/12/2018   CALCIUM 9.5 02/12/2018   GFR 93.96 02/12/2018   Lab Results  Component Value Date   CHOL 201 (H) 02/12/2018   Lab Results  Component Value Date   HDL 30.80 (L) 02/12/2018   Lab Results  Component Value Date   LDLCALC 130 (H) 02/12/2018   Lab Results  Component Value Date   TRIG 198.0 (H) 02/12/2018   Lab Results  Component Value Date   CHOLHDL 7 02/12/2018   Lab Results  Component Value Date   HGBA1C 5.3 11/27/2016   The 10-year ASCVD risk score Mikey Bussing DC Jr., et al., 2013) is: 5.6%   Values used to calculate the score:     Age: 32 years     Sex: Male     Is Non-Hispanic African American: No     Diabetic: No     Tobacco smoker: No     Systolic Blood Pressure: 161 mmHg     Is BP treated: No     HDL Cholesterol: 30.8 mg/dL     Total Cholesterol: 201 mg/dL   Assessment & Plan:   Problem List Items Addressed This Visit    None    Visit Diagnoses    Healthcare maintenance    -  Primary   Relevant Orders   Comprehensive metabolic panel   CBC   Lipid panel   Urinalysis, Routine w reflex microscopic   PSA   Ambulatory referral to Gastroenterology      No orders of the defined types were placed in this encounter.   Follow-up: Return in about 6 months (around 10/16/2019).   Patient was given information on health maintenance and disease prevention.  Referred for colonoscopy.  Discussed possibility of medical treatment for BPH symptoms as his need arises.  May switch to a different statin.   Today's lipid profile.

## 2019-04-17 LAB — LIPID PANEL
Cholesterol: 231 mg/dL — ABNORMAL HIGH (ref <200)
HDL: 33 mg/dL — ABNORMAL LOW (ref >=40)
LDL Cholesterol (Calc): 159 mg/dL (calc) — ABNORMAL HIGH
Non-HDL Cholesterol (Calc): 198 mg/dL (calc) — ABNORMAL HIGH (ref <130)
Total CHOL/HDL Ratio: 7 (calc) — ABNORMAL HIGH (ref <5.0)
Triglycerides: 232 mg/dL — ABNORMAL HIGH (ref <150)

## 2019-04-17 LAB — URINALYSIS, ROUTINE W REFLEX MICROSCOPIC
Bilirubin Urine: NEGATIVE
Glucose, UA: NEGATIVE
Hgb urine dipstick: NEGATIVE
Ketones, ur: NEGATIVE
Leukocytes,Ua: NEGATIVE
Nitrite: NEGATIVE
Protein, ur: NEGATIVE
Specific Gravity, Urine: 1.017 (ref 1.001–1.03)
pH: 7 (ref 5.0–8.0)

## 2019-04-17 LAB — CBC
HCT: 44.5 % (ref 38.5–50.0)
Hemoglobin: 15.6 g/dL (ref 13.2–17.1)
MCH: 31.6 pg (ref 27.0–33.0)
MCHC: 35.1 g/dL (ref 32.0–36.0)
MCV: 90.3 fL (ref 80.0–100.0)
MPV: 10.8 fL (ref 7.5–12.5)
Platelets: 227 10*3/uL (ref 140–400)
RBC: 4.93 10*6/uL (ref 4.20–5.80)
RDW: 13.1 % (ref 11.0–15.0)
WBC: 5.4 10*3/uL (ref 3.8–10.8)

## 2019-04-17 LAB — COMPREHENSIVE METABOLIC PANEL
AG Ratio: 1.7 (calc) (ref 1.0–2.5)
ALT: 42 U/L (ref 9–46)
AST: 20 U/L (ref 10–35)
Albumin: 4.7 g/dL (ref 3.6–5.1)
Alkaline phosphatase (APISO): 57 U/L (ref 35–144)
BUN: 14 mg/dL (ref 7–25)
CO2: 26 mmol/L (ref 20–32)
Calcium: 9.7 mg/dL (ref 8.6–10.3)
Chloride: 102 mmol/L (ref 98–110)
Creat: 0.96 mg/dL (ref 0.70–1.33)
Globulin: 2.7 g/dL (calc) (ref 1.9–3.7)
Glucose, Bld: 100 mg/dL — ABNORMAL HIGH (ref 65–99)
Potassium: 4.6 mmol/L (ref 3.5–5.3)
Sodium: 139 mmol/L (ref 135–146)
Total Bilirubin: 0.7 mg/dL (ref 0.2–1.2)
Total Protein: 7.4 g/dL (ref 6.1–8.1)

## 2019-04-17 LAB — PSA: PSA: 1.4 ng/mL (ref <=4.0)

## 2019-04-19 MED ORDER — ROSUVASTATIN CALCIUM 10 MG PO TABS: 10.0000 mg | ORAL_TABLET | Freq: Every day | ORAL | 1 refills | Status: DC

## 2019-04-19 NOTE — Addendum Note (Signed)
Addended by: Abelino Derrick A on: 04/19/2019 08:30 AM   Modules accepted: Orders

## 2019-04-21 MED ORDER — ROSUVASTATIN CALCIUM 10 MG PO TABS: 10.0000 mg | ORAL_TABLET | Freq: Every day | ORAL | 1 refills | Status: DC

## 2019-04-22 MED FILL — ROSUVASTATIN CALCIUM 10 MG: 10 | 90 days supply | Qty: 90 | Fill #0

## 2019-06-08 DIAGNOSIS — H524 Presbyopia: Secondary | ICD-10-CM | POA: Diagnosis not present

## 2019-07-22 ENCOUNTER — Encounter: Payer: Self-pay | Admitting: Internal Medicine

## 2019-08-26 ENCOUNTER — Encounter: Payer: Self-pay | Admitting: Gastroenterology

## 2019-09-15 ENCOUNTER — Ambulatory Visit (AMBULATORY_SURGERY_CENTER): Payer: 59 | Admitting: *Deleted

## 2019-09-15 ENCOUNTER — Other Ambulatory Visit: Payer: Self-pay

## 2019-09-15 ENCOUNTER — Encounter: Payer: Self-pay | Admitting: Gastroenterology

## 2019-09-15 VITALS — Temp 97.3°F | Ht 72.0 in | Wt 226.0 lb

## 2019-09-15 DIAGNOSIS — Z1159 Encounter for screening for other viral diseases: Secondary | ICD-10-CM

## 2019-09-15 DIAGNOSIS — Z1211 Encounter for screening for malignant neoplasm of colon: Secondary | ICD-10-CM

## 2019-09-15 MED ORDER — SUPREP BOWEL PREP KIT 17.5-3.13-1.6 GM/177ML PO SOLN: 1.0000 | Freq: Once | ORAL | 0 refills | Status: AC

## 2019-09-15 MED FILL — SUPREP BOWEL PREP KIT: 17.5-3.13-1 | 2 days supply | Qty: 354 | Fill #0

## 2019-09-15 NOTE — Progress Notes (Signed)
No egg or soy allergy known to patient  No issues with past sedation with any surgeries  or procedures, no intubation problems  No diet pills per patient No home 02 use per patient  No blood thinners per patient  Pt denies issues with constipation  No A fib or A flutter  EMMI video sent to pt's e mail   cov test 11-30 mon at 330 pm   Due to the COVID-19 pandemic we are asking patients to follow these guidelines. Please only bring one care partner. Please be aware that your care partner may wait in the car in the parking lot or if they feel like they will be too hot to wait in the car, they may wait in the lobby on the 4th floor. All care partners are required to wear a mask the entire time (we do not have any that we can provide them), they need to practice social distancing, and we will do a Covid check for all patient's and care partners when you arrive. Also we will check their temperature and your temperature. If the care partner waits in their car they need to stay in the parking lot the entire time and we will call them on their cell phone when the patient is ready for discharge so they can bring the car to the front of the building. Also all patient's will need to wear a mask into building.

## 2019-09-27 ENCOUNTER — Ambulatory Visit (INDEPENDENT_AMBULATORY_CARE_PROVIDER_SITE_OTHER): Payer: 59

## 2019-09-27 ENCOUNTER — Other Ambulatory Visit: Payer: Self-pay | Admitting: Gastroenterology

## 2019-09-27 DIAGNOSIS — Z1159 Encounter for screening for other viral diseases: Secondary | ICD-10-CM

## 2019-09-28 LAB — SARS CORONAVIRUS 2 (TAT 6-24 HRS): SARS Coronavirus 2: NEGATIVE

## 2019-09-29 ENCOUNTER — Ambulatory Visit (AMBULATORY_SURGERY_CENTER): Payer: 59 | Admitting: Gastroenterology

## 2019-09-29 ENCOUNTER — Encounter: Payer: Self-pay | Admitting: Gastroenterology

## 2019-09-29 ENCOUNTER — Other Ambulatory Visit: Payer: Self-pay

## 2019-09-29 VITALS — BP 118/73 | HR 68 | Temp 97.8°F | Resp 12 | Ht 72.0 in | Wt 226.0 lb

## 2019-09-29 DIAGNOSIS — Z1211 Encounter for screening for malignant neoplasm of colon: Secondary | ICD-10-CM | POA: Diagnosis not present

## 2019-09-29 DIAGNOSIS — K640 First degree hemorrhoids: Secondary | ICD-10-CM

## 2019-09-29 MED ORDER — SODIUM CHLORIDE 0.9 % IV SOLN: 500.0000 mL | Freq: Once | INTRAVENOUS | Status: DC

## 2019-09-29 NOTE — Patient Instructions (Signed)
HANDOUTS PROVIDED ON: HEMORRHOIDS   YOU MAY RESUME YOUR PREVIOUS DIET AND MEDICATION SCHEDULE.  THANK YOU FOR ALLOWING US TO CARE FOR YOU TODAY!!!  YOU HAD AN ENDOSCOPIC PROCEDURE TODAY AT THE [facility]:   Refer to the procedure report that was given to you for any specific questions about what was found during the examination.  If the procedure report does not answer your questions, please call your gastroenterologist to clarify.  If you requested that your care partner not be given the details of your procedure findings, then the procedure report has been included in a sealed envelope for you to review at your convenience later.  YOU SHOULD EXPECT: Some feelings of bloating in the abdomen. Passage of more gas than usual.  Walking can help get rid of the air that was put into your GI tract during the procedure and reduce the bloating. If you had a lower endoscopy (such as a colonoscopy or flexible sigmoidoscopy) you may notice spotting of blood in your stool or on the toilet paper. If you underwent a bowel prep for your procedure, you may not have a normal bowel movement for a few days.  Please Note:  You might notice some irritation and congestion in your nose or some drainage.  This is from the oxygen used during your procedure.  There is no need for concern and it should clear up in a day or so.  SYMPTOMS TO REPORT IMMEDIATELY:   Following lower endoscopy (colonoscopy or flexible sigmoidoscopy):  Excessive amounts of blood in the stool  Significant tenderness or worsening of abdominal pains  Swelling of the abdomen that is new, acute  Fever of 100F or higher  For urgent or emergent issues, a gastroenterologist can be reached at any hour by calling [phone].   DIET:  We do recommend a small meal at first, but then you may proceed to your regular diet.  Drink plenty of fluids but you should avoid alcoholic beverages for 24 hours.  ACTIVITY:  You should plan to take  it easy for the rest of today and you should NOT DRIVE or use heavy machinery until tomorrow (because of the sedation medicines used during the test).    FOLLOW UP: Our staff will call the number listed on your records 48-72 hours following your procedure to check on you and address any questions or concerns that you may have regarding the information given to you following your procedure. If we do not reach you, we will leave a message.  We will attempt to reach you two times.  During this call, we will ask if you have developed any symptoms of COVID 19. If you develop any symptoms (ie: fever, flu-like symptoms, shortness of breath, cough etc.) before then, please call [phone].  If you test positive for Covid 19 in the 2 weeks post procedure, please call and report this information to us.    If any biopsies were taken you will be contacted by phone or by letter within the next 1-3 weeks.  Please call us at [phone] if you have not heard about the biopsies in 3 weeks.    SIGNATURES/CONFIDENTIALITY: You and/or your care partner have signed paperwork which will be entered into your electronic medical record.  These signatures attest to the fact that that the information above on your After Visit Summary has been reviewed and is understood.  Full responsibility of the confidentiality of this discharge information lies with you and/or your care-partner. 

## 2019-09-29 NOTE — Progress Notes (Signed)
To PACU, VSS. Report to [email or web address] 

## 2019-09-29 NOTE — Op Note (Signed)
[facility] Patient Name: Jason Benson Procedure Date: 09/29/2019 10:30 AM MRN: [id number] Endoscopist: Doristine LocksVito Cirigliano , MD Age: 53 Referring MD:  Date of Birth: 1966/03/27 Gender: Male Account #: [id number] Procedure:                Colonoscopy Indications:              Screening for colorectal malignant neoplasm, This                            is the patient's first colonoscopy. He is otherwise                            without GI symptoms and no known family history of                            colon cancer. Medicines:                Monitored Anesthesia Care Procedure:                Pre-Anesthesia Assessment:                           - Prior to the procedure, a History and Physical                            was performed, and patient medications and                            allergies were reviewed. The patient's tolerance of                            previous anesthesia was also reviewed. The risks                            and benefits of the procedure and the sedation                            options and risks were discussed with the patient.                            All questions were answered, and informed consent                            was obtained. Prior Anticoagulants: The patient has                            taken no previous anticoagulant or antiplatelet                            agents. ASA Grade Assessment: II - A patient with                            mild systemic disease. After reviewing the risks  and benefits, the patient was deemed in                            satisfactory condition to undergo the procedure.                           After obtaining informed consent, the colonoscope                            was passed under direct vision. Throughout the                            procedure, the patient's blood pressure, pulse, and                            oxygen saturations were monitored  continuously. The                            Colonoscope was introduced through the anus and                            advanced to the the terminal ileum. The colonoscopy                            was performed without difficulty. The patient                            tolerated the procedure well. The quality of the                            bowel preparation was excellent. The terminal                            ileum, ileocecal valve, appendiceal orifice, and                            rectum were photographed. Scope In: 10:33:39 AM Scope Out: 10:45:59 AM Scope Withdrawal Time: 0 hours 9 minutes 28 seconds  Total Procedure Duration: 0 hours 12 minutes 20 seconds  Findings:                 The perianal and digital rectal examinations were                            normal.                           The colon appeared normal.                           Non-bleeding internal hemorrhoids were found during                            retroflexion. The hemorrhoids were small.  The terminal ileum appeared normal. Complications:            No immediate complications. Estimated Blood Loss:     Estimated blood loss: none. Impression:               - The entire examined colon is normal.                           - Non-bleeding internal hemorrhoids.                           - The examined portion of the ileum was normal.                           - No specimens collected. Recommendation:           - Patient has a contact number available for                            emergencies. The signs and symptoms of potential                            delayed complications were discussed with the                            patient. Return to normal activities tomorrow.                            Written discharge instructions were provided to the                            patient.                           - Resume previous diet.                           - Continue present  medications.                           - Repeat colonoscopy in 10 years for screening                            purposes.                           - Return to GI office PRN. Doristine Locks, MD 09/29/2019 10:49:30 AM

## 2019-09-29 NOTE — Progress Notes (Signed)
Pt's states no medical or surgical changes since previsit or office visit.  JB - temp KA - vitals 

## 2019-10-01 ENCOUNTER — Telehealth: Payer: Self-pay

## 2019-10-01 ENCOUNTER — Telehealth: Payer: Self-pay | Admitting: *Deleted

## 2019-10-01 NOTE — Telephone Encounter (Signed)
Left message on 2nd follow up call. 

## 2019-10-01 NOTE — Telephone Encounter (Signed)
No answer for post procedure follow up call. Left message for patient and will call back.

## 2019-10-06 MED FILL — ROSUVASTATIN CALCIUM 10 MG: 10 | 90 days supply | Qty: 90 | Fill #1

## 2019-12-09 ENCOUNTER — Other Ambulatory Visit: Payer: Self-pay

## 2019-12-10 ENCOUNTER — Ambulatory Visit (INDEPENDENT_AMBULATORY_CARE_PROVIDER_SITE_OTHER): Payer: 59 | Admitting: Family Medicine

## 2019-12-10 ENCOUNTER — Other Ambulatory Visit: Payer: Self-pay | Admitting: Family Medicine

## 2019-12-10 ENCOUNTER — Encounter: Payer: Self-pay | Admitting: Family Medicine

## 2019-12-10 ENCOUNTER — Ambulatory Visit (INDEPENDENT_AMBULATORY_CARE_PROVIDER_SITE_OTHER): Payer: 59

## 2019-12-10 VITALS — BP 140/92 | HR 77 | Temp 97.0°F | Ht 72.0 in | Wt 230.4 lb

## 2019-12-10 DIAGNOSIS — W19XXXA Unspecified fall, initial encounter: Secondary | ICD-10-CM

## 2019-12-10 DIAGNOSIS — S63502A Unspecified sprain of left wrist, initial encounter: Secondary | ICD-10-CM

## 2019-12-10 DIAGNOSIS — S52502A Unspecified fracture of the lower end of left radius, initial encounter for closed fracture: Secondary | ICD-10-CM

## 2019-12-10 DIAGNOSIS — S52592A Other fractures of lower end of left radius, initial encounter for closed fracture: Secondary | ICD-10-CM | POA: Diagnosis not present

## 2019-12-10 DIAGNOSIS — S52572A Other intraarticular fracture of lower end of left radius, initial encounter for closed fracture: Secondary | ICD-10-CM | POA: Diagnosis not present

## 2019-12-10 MED ORDER — IBUPROFEN 800 MG PO TABS: ORAL_TABLET | ORAL | 0 refills | Status: DC

## 2019-12-10 MED FILL — IBUPROFEN 800 MG TAB: 800 | 15 days supply | Qty: 30 | Fill #0

## 2019-12-10 NOTE — Patient Instructions (Signed)
Xray done today, we will contact you with result Ice then heat 2x/day for 10-15 min Take ibuprofen 800mg  1 tab 2x/day with food x 7 days Exercises once daily - see below   Wrist Sprain Rehab Ask your health care provider which exercises are safe for you. Do exercises exactly as told by your health care provider and adjust them as directed. It is normal to feel mild stretching, pulling, tightness, or discomfort as you do these exercises. Stop right away if you feel sudden pain or your pain gets worse. Do not begin these exercises until told by your health care provider. Stretching and range-of-motion exercises These exercises warm up your muscles and joints and improve the movement and flexibility of your wrist. These exercises also help to relieve pain, numbness, and tingling. Wrist flexion range of motion 1. Bend your left / right elbow to a 90-degree angle (right angle) with your palm facing the floor. 2. Bend your wrist forward so your fingers point toward the floor (flexion). 3. Hold this position for __________ seconds. 4. Slowly return to the starting position. Repeat __________ times. Complete this exercise __________ times a day. Wrist extension range of motion 1. Bend your left / right elbow to a 90-degree angle (right angle) with your palm facing the floor. 2. Bend your wrist backward so your fingers point toward the ceiling (extension). 3. Hold this position for __________ seconds. 4. Slowly return to the starting position. Repeat __________ times. Complete this exercise __________ times a day. Ulnar deviation range of motion 1. Bend your left / right elbow to a 90-degree angle (right angle), and rest your forearm on a table with your palm facing down. 2. Keeping your hand flat on the table, bend your left / right wrist toward your small finger (pinkie). This is ulnar deviation. 3. Hold this position for __________ seconds. 4. Slowly return your palm to the starting  position. Repeat __________ times. Complete this exercise __________ times a day. Radial deviation range of motion 1. Bend your left / right elbow to a 90-degree angle (right angle), and rest your forearm on a table with your palm facing down. 2. Keeping your hand flat on the table, bend your left / right wrist toward your thumb. This is radial deviation. 3. Hold this position for __________ seconds. 4. Slowly return your palm to the starting position. Repeat __________ times. Complete this exercise __________ times a day. Dart-thrower's motion This exercise combines all four wrist motions--wrist flexion, wrist extension, ulnar deviation, and radial deviation--into one exercise. The motion is similar to throwing a dart. 1. Sit and rest your left / right elbow on a table. Start with your wrist straight. Keep your fingers relaxed during the exercise. 2. In the same motion, bend your wrist backward (extension) and toward your thumb (radial deviation). Move slowly and go as far as you can. 3. Then, in the same motion, bend your wrist forward (flexion) and toward your small (pinkie) finger (ulnar deviation). Move slowly and go as far as you can. 4. Slowly return to the starting position. Repeat __________ times. Complete this exercise __________ times a day. Wrist flexion stretch  1. Extend your left / right arm in front of you, and turn your palm down toward the floor. ? If told by your health care provider, bend your left / right elbow to a 90-degree angle (right angle) at your side. 2. Using your uninjured hand, gently press over the back of your left / right hand to bend  your wrist and fingers toward the floor (flexion). Go as far as you can to feel a stretch without causing pain. 3. Hold this position for __________ seconds. 4. Slowly return to the starting position. Repeat __________ times. Complete this exercise __________ times a day. Wrist extension stretch  1. Extend your left / right  arm in front of you and turn your palm up toward the ceiling. ? If told by your health care provider, bend your left / right elbow to a 90-degree angle (right angle) at your side. 2. Using your uninjured hand, gently press over the palm of your left / right hand to bend your wrist and fingers toward the floor (extension). Go as far as you can to feel a stretch without causing pain. 3. Hold this position for __________ seconds. 4. Slowly return to the starting position. Repeat __________ times. Complete this exercise __________ times a day. Ulnar deviation stretch 1. Bend your left / right elbow to a 90-degree angle (right angle). Rest your forearm, wrist, and hand on a table with your palm facing the floor. 2. Place your uninjured hand over the top of your left / right hand to keep it from moving during the exercise. 3. Gently move your left / right elbow away from your body. This will turn your wrist toward your small finger (pinkie). This is ulnar deviation. 4. Hold this position for __________ seconds. 5. Slowly return to the starting position. Repeat __________ times. Complete this exercise __________ times a day. Radial deviation stretch 1. Bend your left / right elbow to a 90-degree angle (right angle). Rest your forearm, wrist, and hand on a table with your palm facing the floor. 2. Place your uninjured hand over the top of your left / right hand to keep it from moving during the exercise. 3. Gently move your left / right elbow toward your body. This will turn your wrist toward your thumb (radial deviation). 4. Hold this position for __________ seconds. 5. Slowly return to the starting position. Repeat __________ times. Complete this exercise __________ times a day. Strengthening exercises These exercises build strength and endurance in your wrist. Endurance is the ability to use your muscles for a long time, even after they get tired. Isometric wrist flexion 1. Bend your left / right  elbow to a 90-degree angle (right angle). Rest your forearm, wrist, and hand on a table with your palm facing the floor. 2. Without moving any joints (isometric), tighten your muscles as if you are trying to bend (flex) your wrist. ? Start with partial effort and increase your effort as tolerated. 3. Hold this position for __________ seconds. Then relax your muscles. Repeat __________ times. Complete this exercise __________ times a day. Isometric wrist extension 1. Bend your left / right elbow to a 90-degree angle (right angle). Rest your forearm, wrist, and hand on a table with your palm facing the floor. Place your unaffected hand over the top of the left / right hand. 2. Without moving any joints (isometric), tighten your muscles to lift your left / right hand off the table (extension). Use the unaffected hand to keep the left / right hand on the table. ? Start with partial effort and increase your effort as tolerated. 3. Hold this position for __________ seconds. Then relax your muscles. Repeat __________ times. Complete this exercise __________ times a day. Isometric ulnar deviation 1. Bend your left / right elbow to a 90-degree angle (right angle). Rest your forearm, wrist, and hand on  a table with your thumb facing the ceiling (neutral position). Make a light fist with your left / right fingers. 2. Without moving any joints (isometric), tighten your muscles to press the side of your left / right fist into the tabletop (ulnar deviation). ? Start with partial effort and increase your effort as tolerated. 3. Hold this position for __________ seconds. Then relax your muscles. Repeat __________ times. Complete this exercise __________ times a day. Isometric radial deviation 1. Bend your left / right elbow to a 90-degree angle (right angle). Rest your forearm, wrist, and hand on a table with your thumb facing the ceiling (neutral position). 2. Make a light fist with your left / right fingers.  Place your unaffected hand over the top of your fist. 3. Without moving any joints (isometric), tighten your muscles to lift your left / right fist off the table (radial deviation). Use the unaffected hand to keep the left / right hand on the table. ? Start with partial effort and increase your effort as tolerated. 4. Hold this position for __________ seconds. Then relax your muscles. Repeat __________ times. Complete this exercise __________ times a day. Wrist flexion  1. Sit with your left / right forearm supported on a table or other surface. Bend your elbow to a 90-degree angle (right angle), and rest your hand palm-up over the edge of the table. 2. Hold a __________ weight in your left / right hand. Or, hold an exercise band or tube in both hands, keeping your hands at the same level and hip distance apart. There should be a slight tension in the exercise band or tube. 3. Slowly curl your hand up toward the ceiling (flexion). 4. Hold this position for __________ seconds. 5. Slowly lower your hand back to the starting position. Repeat __________ times. Complete this exercise __________ times a day. Wrist extension  1. Sit with your left / right forearm supported on a table or other surface. Bend your elbow to a 90-degree angle (right angle), and rest your hand palm-down over the edge of the table. 2. Hold a __________ weight in your left / right hand. Or, hold an exercise band or tube in both hands, keeping your hands at the same level and hip distance apart. There should be a slight tension in the exercise band or tube. 3. Slowly curl your hand up toward the ceiling (extension). 4. Hold this position for __________ seconds. 5. Slowly lower your hand back to the starting position. Repeat __________ times. Complete this exercise __________ times a day. This information is not intended to replace advice given to you by your health care provider. Make sure you discuss any questions you have  with your health care provider. Document Revised: 02/01/2019 Document Reviewed: 01/04/2019 Elsevier Patient Education  [place].

## 2019-12-10 NOTE — Progress Notes (Signed)
Jason Benson is a 54 y.o. male  Chief Complaint  Patient presents with  . Wrist Pain    Pt c/o lt wrist pain and swelling x 3weeks.  Pt said it feels a little better.  He fell at work and landed on his wrist to break the fall. Pt has been taking Aleve and also was wearing a wrist brace but stopped.    HPI: Jason Benson is a 54 y.o. male patient of Dr. Ethelene Hal who presents today to discuss Lt wrist pain, swelling x 3 weeks. Pain began after slipping and falling while walking to his car in the rain. He feels his wrist bent back into extension. Overall symptoms improving but still notes swelling and Pt tried wearing a wrist brace/splint and also took aleve but has stopped doing both.  Denies numbness, weakness in LUE.   Past Medical History:  Diagnosis Date  . Hyperlipidemia     Past Surgical History:  Procedure Laterality Date  . VASECTOMY    . WISDOM TOOTH EXTRACTION      Social History   Socioeconomic History  . Marital status: Married    Spouse name: Not on file  . Number of children: Not on file  . Years of education: Not on file  . Highest education level: Not on file  Occupational History  . Not on file  Tobacco Use  . Smoking status: Never Smoker  . Smokeless tobacco: Never Used  Substance and Sexual Activity  . Alcohol use: No  . Drug use: No  . Sexual activity: Not on file  Other Topics Concern  . Not on file  Social History Narrative  . Not on file   Social Determinants of Health   Financial Resource Strain:   . Difficulty of Paying Living Expenses: Not on file  Food Insecurity:   . Worried About Charity fundraiser in the Last Year: Not on file  . Ran Out of Food in the Last Year: Not on file  Transportation Needs:   . Lack of Transportation (Medical): Not on file  . Lack of Transportation (Non-Medical): Not on file  Physical Activity:   . Days of Exercise per Week: Not on file  . Minutes of Exercise per Session: Not on file  Stress:   .  Feeling of Stress : Not on file  Social Connections:   . Frequency of Communication with Friends and Family: Not on file  . Frequency of Social Gatherings with Friends and Family: Not on file  . Attends Religious Services: Not on file  . Active Member of Clubs or Organizations: Not on file  . Attends Archivist Meetings: Not on file  . Marital Status: Not on file  Intimate Partner Violence:   . Fear of Current or Ex-Partner: Not on file  . Emotionally Abused: Not on file  . Physically Abused: Not on file  . Sexually Abused: Not on file    Family History  Problem Relation Age of Onset  . Diabetes Father   . Heart attack Father 16  . Lung cancer Paternal Uncle   . Stroke Mother 60  . Hyperlipidemia Sister   . Colon polyps Neg Hx   . Colon cancer Neg Hx   . Esophageal cancer Neg Hx   . Rectal cancer Neg Hx   . Stomach cancer Neg Hx      Immunization History  Administered Date(s) Administered  . Influenza Split 08/17/2012  . Influenza,inj,Quad PF,6+ Mos 11/27/2016  .  Td 04/09/2010    Outpatient Encounter Medications as of 12/10/2019  Medication Sig  . aspirin EC 81 MG tablet Take 81 mg by mouth daily.  . rosuvastatin (CRESTOR) 10 MG tablet Take 1 tablet (10 mg total) by mouth daily.   No facility-administered encounter medications on file as of 12/10/2019.     ROS: Pertinent positives and negatives noted in HPI. Remainder of ROS non-contributory    No Known Allergies  BP (!) 140/92 (BP Location: Right Arm, Patient Position: Sitting, Cuff Size: Normal)   Pulse 77   Temp (!) 97 F (36.1 C) (Temporal)   Ht 6' (1.829 m)   Wt 230 lb 6.4 oz (104.5 kg)   SpO2 99%   BMI 31.25 kg/m  BP Readings from Last 3 Encounters:  12/10/19 (!) 140/92  09/29/19 118/73  04/16/19 126/78    Physical Exam  Constitutional: He is oriented to person, place, and time. He appears well-developed and well-nourished. No distress.  Cardiovascular: Intact distal pulses.    Musculoskeletal:     Left wrist: Swelling and tenderness present. No deformity or bony tenderness. Decreased range of motion (greatest in wrist extension).  Neurological: He is alert and oriented to person, place, and time. He displays normal reflexes. No cranial nerve deficit. He exhibits normal muscle tone. Coordination normal.  Skin: Skin is warm and dry.  No ecchymosis  Psychiatric: He has a normal mood and affect. His behavior is normal.     A/P:  1. Sprain of left wrist, initial encounter 2. Accidental fall, initial encounter - will r/o fx with xray   - DG Wrist Complete Left - DG Forearm Left - ice/heat Rx: - ibuprofen (ADVIL) 800 MG tablet; 1 tab po BID with food  Dispense: 30 tablet; Refill: 0 - pt to take BID w/ food x 7 days - exercises daily - included in AVS - will call pt with xray result as soon as available Discussed plan and reviewed medications with patient, including risks, benefits, and potential side effects. Pt expressed understand. All questions answered.    This visit occurred during the SARS-CoV-2 public health emergency.  Safety protocols were in place, including screening questions prior to the visit, additional usage of staff PPE, and extensive cleaning of exam room while observing appropriate contact time as indicated for disinfecting solutions.

## 2019-12-15 ENCOUNTER — Encounter: Payer: Self-pay | Admitting: Orthopaedic Surgery

## 2019-12-15 ENCOUNTER — Other Ambulatory Visit: Payer: Self-pay

## 2019-12-15 ENCOUNTER — Ambulatory Visit: Payer: 59 | Admitting: Orthopaedic Surgery

## 2019-12-15 VITALS — Ht 72.0 in | Wt 230.0 lb

## 2019-12-15 DIAGNOSIS — S52572A Other intraarticular fracture of lower end of left radius, initial encounter for closed fracture: Secondary | ICD-10-CM

## 2019-12-15 NOTE — Progress Notes (Signed)
Office Visit Note   Patient: Jason Benson           Date of Birth: Jul 04, 1966           MRN: [id number] Visit Date: 12/15/2019              Requested by: Ronnald Nian, DO [place],  [place] [id number] PCP: Libby Maw, MD   Assessment & Plan: Visit Diagnoses:  1. Other closed intra-articular fracture of distal end of left radius, initial encounter     Plan: Impression is subacute left distal radius fracture.  I reviewed the x-rays which show an intra-articular distal radius fracture with loss of radial height and dorsal tilt of approximately 14 degrees.  Given the chronicity of the fracture and the extensive healing we discussed the details of doing surgery versus letting this heal and and rehabbing this.  Based on our discussion he has agreed to continue with nonoperative treatment.  He does have a wrist brace that he can wear for the next couple weeks as tolerated.  I do want [place] to send him to hand and wrist rehab at this time.  Recheck in 4 weeks with two-view x-rays of the left wrist.  Follow-Up Instructions: Return in about 4 weeks (around 01/12/2020).   Orders:  No orders of the defined types were placed in this encounter.  No orders of the defined types were placed in this encounter.     Procedures: No procedures performed   Clinical Data: No additional findings.   Subjective: Chief Complaint  Patient presents with  . Left Wrist - Pain    Patient is a very pleasant 54 year old right-hand-dominant gentleman who comes in for evaluation.  Left distal radius fracture he suffered 4 weeks ago when he slipped on a wet parking lot and fell directly on his outstretched left hand.  He thought that it was sprained and so he did not seek any medical attention until just 4 to 5 days ago.  He had an x-ray that was performed of his left wrist which showed intra-articular distal radius fracture.  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  He denies any numbness and tingling.  He does endorse some limitation in wrist range of motion.  He has not had to take any more medications for pain.   Review of Systems  Constitutional: Negative.   All other systems reviewed and are negative.    Objective: Vital Signs: Ht 6' (1.829 m)   Wt 230 lb (104.3 kg)   BMI 31.19 kg/m   Physical Exam Vitals and nursing note reviewed.  Constitutional:      Appearance: He is well-developed.  HENT:     Head: Normocephalic and atraumatic.  Eyes:     Pupils: Pupils are equal, round, and reactive to light.  Pulmonary:     Effort: Pulmonary effort is normal.  Abdominal:     Palpations: Abdomen is soft.  Musculoskeletal:        General: Normal range of motion.     Cervical back: Neck supple.  Skin:    General: Skin is warm.  Neurological:     Mental Status: He is alert and oriented to person, place, and time.  Psychiatric:        Behavior: Behavior normal.        Thought Content: Thought content normal.        Judgment: Judgment normal.     Ortho Exam Left  wrist exam shows moderate swelling.  No neurovascular compromise.  He has moderate limitation range of motion with mild discomfort. Specialty Comments:  No specialty comments available.  Imaging: No results found.   PMFS History: There are no problems to display for this patient.  Past Medical History:  Diagnosis Date  . Hyperlipidemia     Family History  Problem Relation Age of Onset  . Diabetes Father   . Heart attack Father 26  . Lung cancer Paternal Uncle   . Stroke Mother 90  . Hyperlipidemia Sister   . Colon polyps Neg Hx   . Colon cancer Neg Hx   . Esophageal cancer Neg Hx   . Rectal cancer Neg Hx   . Stomach cancer Neg Hx     Past Surgical History:  Procedure Laterality Date  . VASECTOMY    . WISDOM TOOTH EXTRACTION     Social History   Occupational History  . Not on file  Tobacco Use  . Smoking status: Never Smoker  .  Smokeless tobacco: Never Used  Substance and Sexual Activity  . Alcohol use: No  . Drug use: No  . Sexual activity: Not on file

## 2020-01-12 ENCOUNTER — Ambulatory Visit: Payer: 59 | Admitting: Orthopaedic Surgery

## 2020-01-18 ENCOUNTER — Other Ambulatory Visit: Payer: Self-pay

## 2020-01-18 ENCOUNTER — Ambulatory Visit: Payer: 59 | Admitting: Orthopaedic Surgery

## 2020-01-18 ENCOUNTER — Ambulatory Visit (INDEPENDENT_AMBULATORY_CARE_PROVIDER_SITE_OTHER): Payer: 59

## 2020-01-18 ENCOUNTER — Encounter: Payer: Self-pay | Admitting: Orthopaedic Surgery

## 2020-01-18 DIAGNOSIS — S52572A Other intraarticular fracture of lower end of left radius, initial encounter for closed fracture: Secondary | ICD-10-CM

## 2020-01-18 NOTE — Progress Notes (Signed)
   Office Visit Note   Patient: Jason Benson           Date of Birth: [birth year]           MRN: [id number] Visit Date: 01/18/2020              Requested by: Mliss Sax, MD [address] [place],  [place] [address] PCP: Mliss Sax, MD   Assessment & Plan: Visit Diagnoses:  1. Other closed intra-articular fracture of distal end of left radius, initial encounter     Plan: Impression is 6 weeks status post left intra-articular distal radius fracture with evidence of continued healing and without evidence of displacement on imaging.  At this point, he will continue with his range of motion exercises.  He will advance with activity as tolerated.  Follow-up with [place] as needed.  Follow-Up Instructions: Return if symptoms worsen or fail to improve.   Orders:  Orders Placed This Encounter  Procedures  . XR Wrist 2 Views Left   No orders of the defined types were placed in this encounter.     Procedures: No procedures performed   Clinical Data: No additional findings.   Subjective: Chief Complaint  Patient presents with  . Left Wrist - Follow-up    HPI patient is a pleasant 54 year old gentleman who comes in today approximately 6 weeks out left wrist intra-articular distal radius fracture.  He has been doing well.  Minimal to no pain.  He only wore his removable splint for about 2 weeks.  He has been to 1 physical therapy visit and has continued with a home exercise program on his own for the past 1 to 2 weeks.     Objective: Vital Signs: There were no vitals taken for this visit.    Ortho Exam examination of his left wrist reveals no swelling.  No tenderness to the fracture site.  He does have somewhat limited range of motion with wrist flexion and extension.  He is neurovascular intact distally.  Specialty Comments:  No specialty comments available.  Imaging: XR Wrist 2 Views Left  Result Date: 01/18/2020 X-rays demonstrate stable  alignment of the fracture without further displacement.  There is evidence of bony consolidation and callus formation    PMFS History: There are no problems to display for this patient.  Past Medical History:  Diagnosis Date  . Hyperlipidemia     Family History  Problem Relation Age of Onset  . Diabetes Father   . Heart attack Father 72  . Lung cancer Paternal Uncle   . Stroke Mother 68  . Hyperlipidemia Sister   . Colon polyps Neg Hx   . Colon cancer Neg Hx   . Esophageal cancer Neg Hx   . Rectal cancer Neg Hx   . Stomach cancer Neg Hx     Past Surgical History:  Procedure Laterality Date  . VASECTOMY    . WISDOM TOOTH EXTRACTION     Social History   Occupational History  . Not on file  Tobacco Use  . Smoking status: Never Smoker  . Smokeless tobacco: Never Used  Substance and Sexual Activity  . Alcohol use: No  . Drug use: No  . Sexual activity: Not on file

## 2020-02-09 ENCOUNTER — Other Ambulatory Visit: Payer: Self-pay | Admitting: Family Medicine

## 2020-02-09 MED FILL — ROSUVASTATIN CALCIUM 10 MG: 10 | 60 days supply | Qty: 60 | Fill #0

## 2020-04-18 ENCOUNTER — Other Ambulatory Visit: Payer: Self-pay

## 2020-04-19 ENCOUNTER — Ambulatory Visit (INDEPENDENT_AMBULATORY_CARE_PROVIDER_SITE_OTHER): Payer: 59 | Admitting: Family Medicine

## 2020-04-19 ENCOUNTER — Encounter: Payer: Self-pay | Admitting: Family Medicine

## 2020-04-19 VITALS — BP 138/84 | HR 76 | Temp 97.7°F | Ht 72.0 in | Wt 225.8 lb

## 2020-04-19 DIAGNOSIS — E785 Hyperlipidemia, unspecified: Secondary | ICD-10-CM

## 2020-04-19 DIAGNOSIS — I10 Essential (primary) hypertension: Secondary | ICD-10-CM | POA: Insufficient documentation

## 2020-04-19 DIAGNOSIS — Z0000 Encounter for general adult medical examination without abnormal findings: Secondary | ICD-10-CM

## 2020-04-19 LAB — COMPREHENSIVE METABOLIC PANEL
ALT: 43 U/L (ref 0–53)
AST: 25 U/L (ref 0–37)
Albumin: 4.7 g/dL (ref 3.5–5.2)
Alkaline Phosphatase: 54 U/L (ref 39–117)
BUN: 14 mg/dL (ref 6–23)
CO2: 27 mEq/L (ref 19–32)
Calcium: 9.5 mg/dL (ref 8.4–10.5)
Chloride: 103 mEq/L (ref 96–112)
Creatinine, Ser: 0.93 mg/dL (ref 0.40–1.50)
GFR: 85.46 mL/min (ref >60.00)
Glucose, Bld: 106 mg/dL — ABNORMAL HIGH (ref 70–99)
Potassium: 4.1 mEq/L (ref 3.5–5.1)
Sodium: 137 mEq/L (ref 135–145)
Total Bilirubin: 0.7 mg/dL (ref 0.2–1.2)
Total Protein: 7 g/dL (ref 6.0–8.3)

## 2020-04-19 LAB — URINALYSIS, ROUTINE W REFLEX MICROSCOPIC
Bilirubin Urine: NEGATIVE
Hgb urine dipstick: NEGATIVE
Ketones, ur: NEGATIVE
Leukocytes,Ua: NEGATIVE
Nitrite: NEGATIVE
RBC / HPF: NONE SEEN (ref 0–?)
Specific Gravity, Urine: 1.015 (ref 1.000–1.030)
Total Protein, Urine: NEGATIVE
Urine Glucose: NEGATIVE
Urobilinogen, UA: 0.2 (ref 0.0–1.0)
pH: 6 (ref 5.0–8.0)

## 2020-04-19 LAB — CBC
HCT: 43.9 % (ref 39.0–52.0)
Hemoglobin: 15.4 g/dL (ref 13.0–17.0)
MCHC: 35.1 g/dL (ref 30.0–36.0)
MCV: 92.5 fl (ref 78.0–100.0)
Platelets: 190 10*3/uL (ref 150.0–400.0)
RBC: 4.75 Mil/uL (ref 4.22–5.81)
RDW: 12.8 % (ref 11.5–15.5)
WBC: 5.5 10*3/uL (ref 4.0–10.5)

## 2020-04-19 LAB — LIPID PANEL
Cholesterol: 123 mg/dL (ref 0–200)
HDL: 29.9 mg/dL — ABNORMAL LOW (ref >39.00)
LDL Cholesterol: 60 mg/dL (ref 0–99)
NonHDL: 93.11
Total CHOL/HDL Ratio: 4
Triglycerides: 165 mg/dL — ABNORMAL HIGH (ref 0.0–149.0)
VLDL: 33 mg/dL (ref 0.0–40.0)

## 2020-04-19 LAB — LDL CHOLESTEROL, DIRECT: Direct LDL: 68 mg/dL

## 2020-04-19 LAB — HEMOGLOBIN A1C: Hgb A1c MFr Bld: 5.2 % (ref 4.6–6.5)

## 2020-04-19 LAB — PSA: PSA: 1.42 ng/mL (ref 0.10–4.00)

## 2020-04-19 NOTE — Patient Instructions (Signed)
Health Maintenance, Male Adopting a healthy lifestyle and getting preventive care are important in promoting health and wellness. Ask your health care provider about:  The right schedule for you to have regular tests and exams.  Things you can do on your own to prevent diseases and keep yourself healthy. What should I know about diet, weight, and exercise? Eat a healthy diet   Eat a diet that includes plenty of vegetables, fruits, low-fat dairy products, and lean protein.  Do not eat a lot of foods that are high in solid fats, added sugars, or sodium. Maintain a healthy weight Body mass index (BMI) is a measurement that can be used to identify possible weight problems. It estimates body fat based on height and weight. Your health care provider can help determine your BMI and help you achieve or maintain a healthy weight. Get regular exercise Get regular exercise. This is one of the most important things you can do for your health. Most adults should:  Exercise for at least 150 minutes each week. The exercise should increase your heart rate and make you sweat (moderate-intensity exercise).  Do strengthening exercises at least twice a week. This is in addition to the moderate-intensity exercise.  Spend less time sitting. Even light physical activity can be beneficial. Watch cholesterol and blood lipids Have your blood tested for lipids and cholesterol at 52 years of age, then have this test every 5 years. You may need to have your cholesterol levels checked more often if:  Your lipid or cholesterol levels are high.  You are older than 52 years of age.  You are at high risk for heart disease. What should I know about cancer screening? Many types of cancers can be detected early and may often be prevented. Depending on your health history and family history, you may need to have cancer screening at various ages. This may include screening for:  Colorectal cancer.  Prostate  cancer.  Skin cancer.  Lung cancer. What should I know about heart disease, diabetes, and high blood pressure? Blood pressure and heart disease  High blood pressure causes heart disease and increases the risk of stroke. This is more likely to develop in people who have high blood pressure readings, are of African descent, or are overweight.  Talk with your health care provider about your target blood pressure readings.  Have your blood pressure checked: ? Every 3-5 years if you are 18-39 years of age. ? Every year if you are 40 years old or older.  If you are between the ages of 65 and 75 and are a current or former smoker, ask your health care provider if you should have a one-time screening for abdominal aortic aneurysm (AAA). Diabetes Have regular diabetes screenings. This checks your fasting blood sugar level. Have the screening done:  Once every three years after age 45 if you are at a normal weight and have a low risk for diabetes.  More often and at a younger age if you are overweight or have a high risk for diabetes. What should I know about preventing infection? Hepatitis B If you have a higher risk for hepatitis B, you should be screened for this virus. Talk with your health care provider to find out if you are at risk for hepatitis B infection. Hepatitis C Blood testing is recommended for:  Everyone born from 1945 through 1965.  Anyone with known risk factors for hepatitis C. Sexually transmitted infections (STIs)  You should be screened each year   for STIs, including gonorrhea and chlamydia, if: ? You are sexually active and are younger than 52 years of age. ? You are older than 52 years of age and your health care provider tells you that you are at risk for this type of infection. ? Your sexual activity has changed since you were last screened, and you are at increased risk for chlamydia or gonorrhea. Ask your health care provider if you are at risk.  Ask your  health care provider about whether you are at high risk for HIV. Your health care provider may recommend a prescription medicine to help prevent HIV infection. If you choose to take medicine to prevent HIV, you should first get tested for HIV. You should then be tested every 3 months for as long as you are taking the medicine. Follow these instructions at home: Lifestyle  Do not use any products that contain nicotine or tobacco, such as cigarettes, e-cigarettes, and chewing tobacco. If you need help quitting, ask your health care provider.  Do not use street drugs.  Do not share needles.  Ask your health care provider for help if you need support or information about quitting drugs. Alcohol use  Do not drink alcohol if your health care provider tells you not to drink.  If you drink alcohol: ? Limit how much you have to 0-2 drinks a day. ? Be aware of how much alcohol is in your drink. In the [place] one drink equals one 12 oz bottle of beer (355 mL), one 5 oz glass of wine (148 mL), or one 1 oz glass of hard liquor (44 mL). General instructions  Schedule regular health, dental, and eye exams.  Stay current with your vaccines.  Tell your health care provider if: ? You often feel depressed. ? You have ever been abused or do not feel safe at home. Summary  Adopting a healthy lifestyle and getting preventive care are important in promoting health and wellness.  Follow your health care provider's instructions about healthy diet, exercising, and getting tested or screened for diseases.  Follow your health care provider's instructions on monitoring your cholesterol and blood pressure. This information is not intended to replace advice given to you by your health care provider. Make sure you discuss any questions you have with your health care provider. Document Revised: 10/07/2018 Document Reviewed: 10/07/2018 Elsevier Patient Education  2020 [employer]  Preventing High  Cholesterol Cholesterol is a white, waxy substance similar to fat that the human body needs to help build cells. The liver makes all the cholesterol that a person's body needs. Having high cholesterol (hypercholesterolemia) increases a person's risk for heart disease and stroke. Extra (excess) cholesterol comes from the food the person eats. High cholesterol can often be prevented with diet and lifestyle changes. If you already have high cholesterol, you can control it with diet and lifestyle changes and with medicine. How can high cholesterol affect me? If you have high cholesterol, deposits (plaques) may build up on the walls of your arteries. The arteries are the blood vessels that carry blood away from your heart. Plaques make the arteries narrower and stiffer. This can limit or block blood flow and cause blood clots to form. Blood clots:  Are tiny balls of cells that form in your blood.  Can move to the heart or brain, causing a heart attack or stroke. Plaques in arteries greatly increase your risk for heart attack and stroke.Making diet and lifestyle changes can reduce your risk for these   conditions that may threaten your life. What can increase my risk? This condition is more likely to develop in people who:  Eat foods that are high in saturated fat or cholesterol. Saturated fat is mostly found in: ? Foods that contain animal fat, such as red meat and some dairy products. ? Certain fatty foods made from plants, such as tropical oils.  Are overweight.  Are not getting enough exercise.  Have a family history of high cholesterol. What actions can I take to prevent this? Nutrition   Eat less saturated fat.  Avoid trans fats (partially hydrogenated oils). These are often found in margarine and in some baked goods, fried foods, and snacks bought in packages.  Avoid precooked or cured meat, such as sausages or meat loaves.  Avoid foods and drinks that have added sugars.  Eat more  fruits, vegetables, and whole grains.  Choose healthy sources of protein, such as fish, poultry, lean cuts of red meat, beans, peas, lentils, and nuts.  Choose healthy sources of fat, such as: ? Nuts. ? Vegetable oils, especially olive oil. ? Fish that have healthy fats (omega-3 fatty acids), such as mackerel or salmon. The items listed above may not be a complete list of recommended foods and beverages. Contact a dietitian for more information. Lifestyle  Lose weight if you are overweight. Losing 5-10 lb (2.3-4.5 kg) can help prevent or control high cholesterol. It can also lower your risk for diabetes and high blood pressure. Ask your health care provider to help you with a diet and exercise plan to lose weight safely.  Do not use any products that contain nicotine or tobacco, such as cigarettes, e-cigarettes, and chewing tobacco. If you need help quitting, ask your health care provider.  Limit your alcohol intake. ? Do not drink alcohol if:  Your health care provider tells you not to drink.  You are pregnant, may be pregnant, or are planning to become pregnant. ? If you drink alcohol:  Limit how much you use to:  0-1 drink a day for women.  0-2 drinks a day for men.  Be aware of how much alcohol is in your drink. In the [place] one drink equals one 12 oz bottle of beer (355 mL), one 5 oz glass of wine (148 mL), or one 1 oz glass of hard liquor (44 mL). Activity   Get enough exercise. Each week, do at least 150 minutes of exercise that takes a medium level of effort (moderate-intensity exercise). ? This is exercise that:  Makes your heart beat faster and makes you breathe harder than usual.  Allows you to still be able to talk. ? You could exercise in short sessions several times a day or longer sessions a few times a week. For example, on 5 days each week, you could walk fast or ride your bike 3 times a day for 10 minutes each time.  Do exercises as told by your health care  provider. Medicines  In addition to diet and lifestyle changes, your health care provider may recommend medicines to help lower cholesterol. This may be a medicine to lower the amount of cholesterol your liver makes. You may need medicine if: ? Diet and lifestyle changes do not lower your cholesterol enough. ? You have high cholesterol and other risk factors for heart disease or stroke.  Take over-the-counter and prescription medicines only as told by your health care provider. General information  Manage your risk factors for high cholesterol. Talk with your health care   provider about all your risk factors and how to lower your risk.  Manage other conditions that you have, such as diabetes or high blood pressure (hypertension).  Have blood tests to check your cholesterol levels at regular points in time as told by your health care provider.  Keep all follow-up visits as told by your health care provider. This is important. Where to find more information  American Heart Association: www.heart.org  National Heart, Lung, and Blood Institute: www.nhlbi.nih.gov Summary  High cholesterol increases your risk for heart disease and stroke. By keeping your cholesterol level low, you can reduce your risk for these conditions.  High cholesterol can often be prevented with diet and lifestyle changes.  Work with your health care provider to manage your risk factors, and have your blood tested regularly. This information is not intended to replace advice given to you by your health care provider. Make sure you discuss any questions you have with your health care provider. Document Revised: 02/05/2019 Document Reviewed: 06/22/2016 Elsevier Patient Education  2020 [employer]  Preventive Care 40-64 Years Old, Male Preventive care refers to lifestyle choices and visits with your health care provider that can promote health and wellness. This includes:  A yearly physical exam. This is also  called an annual well check.  Regular dental and eye exams.  Immunizations.  Screening for certain conditions.  Healthy lifestyle choices, such as eating a healthy diet, getting regular exercise, not using drugs or products that contain nicotine and tobacco, and limiting alcohol use. What can I expect for my preventive care visit? Physical exam Your health care provider will check:  Height and weight. These may be used to calculate body mass index (BMI), which is a measurement that tells if you are at a healthy weight.  Heart rate and blood pressure.  Your skin for abnormal spots. Counseling Your health care provider may ask you questions about:  Alcohol, tobacco, and drug use.  Emotional well-being.  Home and relationship well-being.  Sexual activity.  Eating habits.  Work and work environment. What immunizations do I need?  Influenza (flu) vaccine  This is recommended every year. Tetanus, diphtheria, and pertussis (Tdap) vaccine  You may need a Td booster every 10 years. Varicella (chickenpox) vaccine  You may need this vaccine if you have not already been vaccinated. Zoster (shingles) vaccine  You may need this after age 60. Measles, mumps, and rubella (MMR) vaccine  You may need at least one dose of MMR if you were born in 1957 or later. You may also need a second dose. Pneumococcal conjugate (PCV13) vaccine  You may need this if you have certain conditions and were not previously vaccinated. Pneumococcal polysaccharide (PPSV23) vaccine  You may need one or two doses if you smoke cigarettes or if you have certain conditions. Meningococcal conjugate (MenACWY) vaccine  You may need this if you have certain conditions. Hepatitis A vaccine  You may need this if you have certain conditions or if you travel or work in places where you may be exposed to hepatitis A. Hepatitis B vaccine  You may need this if you have certain conditions or if you travel or  work in places where you may be exposed to hepatitis B. Haemophilus influenzae type b (Hib) vaccine  You may need this if you have certain risk factors. Human papillomavirus (HPV) vaccine  If recommended by your health care provider, you may need three doses over 6 months. You may receive vaccines as individual doses or   as more than one vaccine together in one shot (combination vaccines). Talk with your health care provider about the risks and benefits of combination vaccines. What tests do I need? Blood tests  Lipid and cholesterol levels. These may be checked every 5 years, or more frequently if you are over 50 years old.  Hepatitis C test.  Hepatitis B test. Screening  Lung cancer screening. You may have this screening every year starting at age 55 if you have a 30-pack-year history of smoking and currently smoke or have quit within the past 15 years.  Prostate cancer screening. Recommendations will vary depending on your family history and other risks.  Colorectal cancer screening. All adults should have this screening starting at age 50 and continuing until age 75. Your health care provider may recommend screening at age 45 if you are at increased risk. You will have tests every 1-10 years, depending on your results and the type of screening test.  Diabetes screening. This is done by checking your blood sugar (glucose) after you have not eaten for a while (fasting). You may have this done every 1-3 years.  Sexually transmitted disease (STD) testing. Follow these instructions at home: Eating and drinking  Eat a diet that includes fresh fruits and vegetables, whole grains, lean protein, and low-fat dairy products.  Take vitamin and mineral supplements as recommended by your health care provider.  Do not drink alcohol if your health care provider tells you not to drink.  If you drink alcohol: ? Limit how much you have to 0-2 drinks a day. ? Be aware of how much alcohol is in  your drink. In the [place] one drink equals one 12 oz bottle of beer (355 mL), one 5 oz glass of wine (148 mL), or one 1 oz glass of hard liquor (44 mL). Lifestyle  Take daily care of your teeth and gums.  Stay active. Exercise for at least 30 minutes on 5 or more days each week.  Do not use any products that contain nicotine or tobacco, such as cigarettes, e-cigarettes, and chewing tobacco. If you need help quitting, ask your health care provider.  If you are sexually active, practice safe sex. Use a condom or other form of protection to prevent STIs (sexually transmitted infections).  Talk with your health care provider about taking a low-dose aspirin every day starting at age 50. What's next?  Go to your health care provider once a year for a well check visit.  Ask your health care provider how often you should have your eyes and teeth checked.  Stay up to date on all vaccines. This information is not intended to replace advice given to you by your health care provider. Make sure you discuss any questions you have with your health care provider. Document Revised: 10/08/2018 Document Reviewed: 10/08/2018 Elsevier Patient Education  2020 [employer]  

## 2020-04-19 NOTE — Progress Notes (Signed)
Established Patient Office Visit  Subjective:  [name], male    DOB: Nov 22, 1965  Age: 54 y.o. MRN: [id number]  CC:  Chief Complaint  Patient presents with  . Annual Exam    CPE, no concerns.     HPI Jason Benson presents for a physical exam and follow-up of his elevated cholesterol.  Has been lost to follow-up over the last few years.  Has been taking his Crestor intermittently.  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  Hoping that his son will come to work with him and help.  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  Regular dental care.  Has had the Covid vaccine.  Past Medical History:  Diagnosis Date  . Hyperlipidemia     Past Surgical History:  Procedure Laterality Date  . VASECTOMY    . WISDOM TOOTH EXTRACTION      Family History  Problem Relation Age of Onset  . Diabetes Father   . Heart attack Father 83  . Lung cancer Paternal Uncle   . Stroke Mother 80  . Hyperlipidemia Sister   . Colon polyps Neg Hx   . Colon cancer Neg Hx   . Esophageal cancer Neg Hx   . Rectal cancer Neg Hx   . Stomach cancer Neg Hx     Social History   Socioeconomic History  . Marital status: Married    Spouse name: Not on file  . Number of children: Not on file  . Years of education: Not on file  . Highest education level: Not on file  Occupational History  . Not on file  Tobacco Use  . Smoking status: Never Smoker  . Smokeless tobacco: Never Used  Vaping Use  . Vaping Use: Never used  Substance and Sexual Activity  . Alcohol use: No  . Drug use: No  . Sexual activity: Not on file  Other Topics Concern  . Not on file  Social History Narrative  . Not on file   Social Determinants of Health   Financial Resource Strain:   . Difficulty of Paying Living Expenses:   Food Insecurity:   .  Worried About Programme researcher, broadcasting/film/video in the Last Year:   . Barista in the Last Year:   Transportation Needs:   . Freight forwarder (Medical):   Marland Kitchen Lack of Transportation (Non-Medical):   Physical Activity:   . Days of Exercise per Week:   . Minutes of Exercise per Session:   Stress:   . Feeling of Stress :   Social Connections:   . Frequency of Communication with Friends and Family:   . Frequency of Social Gatherings with Friends and Family:   . Attends Religious Services:   . Active Member of Clubs or Organizations:   . Attends Banker Meetings:   Marland Kitchen Marital Status:   Intimate Partner Violence:   . Fear of Current or Ex-Partner:   . Emotionally Abused:   Marland Kitchen Physically Abused:   . Sexually Abused:     Outpatient Medications Prior to Visit  Medication Sig Dispense Refill  . aspirin EC 81 MG tablet Take 81 mg by mouth daily.    . rosuvastatin (CRESTOR) 10 MG tablet TAKE 1 TABLET BY MOUTH ONCE A DAY 60 tablet 0  .  ibuprofen (ADVIL) 800 MG tablet 1 tab po BID with food (Patient not taking: Reported on 04/19/2020) 30 tablet 0   No facility-administered medications prior to visit.    No Known Allergies  ROS Review of Systems  Constitutional: Negative.   HENT: Negative.   Eyes: Negative for photophobia and visual disturbance.  Respiratory: Negative.   Cardiovascular: Negative.   Gastrointestinal: Negative.   Endocrine: Negative for polyphagia and polyuria.  Genitourinary: Negative.   Musculoskeletal: Negative.   Allergic/Immunologic: Negative for immunocompromised state.  Neurological: Negative for light-headedness and numbness.  Hematological: Does not bruise/bleed easily.   Depression screen [facility] 2/9 04/19/2020 10/24/2017 03/28/2017  Decreased Interest 0 0 0  Down, Depressed, Hopeless 0 0 0  PHQ - 2 Score 0 0 0  Altered sleeping - - 0  Tired, decreased energy - - 0  Change in appetite - - 0  Feeling bad or failure about yourself  - - 0  Trouble  concentrating - - 0  Moving slowly or fidgety/restless - - 0  Suicidal thoughts - - 0  PHQ-9 Score - - 0      Objective:    Physical Exam Vitals and nursing note reviewed.  Constitutional:      General: He is not in acute distress.    Appearance: Normal appearance. He is normal weight. He is not ill-appearing, toxic-appearing or diaphoretic.  HENT:     Head: Normocephalic and atraumatic.     Right Ear: Tympanic membrane, ear canal and external ear normal. There is no impacted cerumen.     Left Ear: Tympanic membrane, ear canal and external ear normal. There is no impacted cerumen.     Mouth/Throat:     Mouth: Mucous membranes are moist.     Pharynx: Oropharynx is clear. No oropharyngeal exudate or posterior oropharyngeal erythema.  Eyes:     Conjunctiva/sclera: Conjunctivae normal.  Cardiovascular:     Rate and Rhythm: Normal rate and regular rhythm.  Pulmonary:     Effort: Pulmonary effort is normal.     Breath sounds: Normal breath sounds.  Abdominal:     General: Abdomen is flat. Bowel sounds are normal. There is no distension.     Palpations: Abdomen is soft. There is no mass.     Tenderness: There is no abdominal tenderness. There is no guarding or rebound.     Hernia: No hernia is present.  Musculoskeletal:     Right lower leg: No edema.     Left lower leg: No edema.  Skin:    General: Skin is warm and dry.  Neurological:     Mental Status: He is alert and oriented to person, place, and time.  Psychiatric:        Mood and Affect: Mood normal.        Behavior: Behavior normal.     BP 138/84   Pulse 76   Temp 97.7 F (36.5 C) (Tympanic)   Ht 6' (1.829 m)   Wt 225 lb 12.8 oz (102.4 kg)   SpO2 95%   BMI 30.62 kg/m  Wt Readings from Last 3 Encounters:  04/19/20 225 lb 12.8 oz (102.4 kg)  12/15/19 230 lb (104.3 kg)  12/10/19 230 lb 6.4 oz (104.5 kg)     Health Maintenance Due  Topic Date Due  . Hepatitis C Screening  Never done    There are no  preventive care reminders to display for this patient.  Lab Results  Component Value Date   TSH 0.99  02/12/2018   Lab Results  Component Value Date   WBC 5.4 04/16/2019   HGB 15.6 04/16/2019   HCT 44.5 04/16/2019   MCV 90.3 04/16/2019   PLT 227 04/16/2019   Lab Results  Component Value Date   NA 139 04/16/2019   K 4.6 04/16/2019   CO2 26 04/16/2019   GLUCOSE 100 (H) 04/16/2019   BUN 14 04/16/2019   CREATININE 0.96 04/16/2019   BILITOT 0.7 04/16/2019   ALKPHOS 61 02/12/2018   AST 20 04/16/2019   ALT 42 04/16/2019   PROT 7.4 04/16/2019   ALBUMIN 4.5 02/12/2018   CALCIUM 9.7 04/16/2019   GFR 93.96 02/12/2018   Lab Results  Component Value Date   CHOL 231 (H) 04/16/2019   Lab Results  Component Value Date   HDL 33 (L) 04/16/2019   Lab Results  Component Value Date   LDLCALC 159 (H) 04/16/2019   Lab Results  Component Value Date   TRIG 232 (H) 04/16/2019   Lab Results  Component Value Date   CHOLHDL 7.0 (H) 04/16/2019   Lab Results  Component Value Date   HGBA1C 5.3 11/27/2016      Assessment & Plan:   Problem List Items Addressed This Visit      Other   Elevated lipids - Primary   Relevant Orders   Comprehensive metabolic panel   LDL cholesterol, direct   Lipid panel    Other Visit Diagnoses    Healthcare maintenance       Relevant Orders   CBC   Comprehensive metabolic panel   PSA   Urinalysis, Routine w reflex microscopic   Hepatitis C antibody   Hemoglobin A1c      No orders of the defined types were placed in this encounter.   Follow-up: Return in about 6 months (around 10/19/2020).   Given information on health maintenance and disease prevention and preventing high cholesterol.  Encouraged him to take his cholesterol medicine daily.  May give him a higher dose to have a better chance of lowering his cholesterol if the drug is going to be taken intermittently. Libby Maw, MD

## 2020-04-20 LAB — HEPATITIS C ANTIBODY
Hepatitis C Ab: NONREACTIVE
SIGNAL TO CUT-OFF: 0.01 (ref <1.00)

## 2020-05-15 ENCOUNTER — Other Ambulatory Visit: Payer: Self-pay | Admitting: Family Medicine

## 2020-05-15 MED FILL — ROSUVASTATIN CALCIUM 10 MG: 10 | 90 days supply | Qty: 90 | Fill #0

## 2020-05-30 MED FILL — ROSUVASTATIN CALCIUM 10 MG: 10 | 90 days supply | Qty: 90 | Fill #0

## 2020-07-18 DIAGNOSIS — H524 Presbyopia: Secondary | ICD-10-CM | POA: Diagnosis not present

## 2021-03-15 ENCOUNTER — Telehealth: Payer: Self-pay | Admitting: Family Medicine

## 2021-03-15 NOTE — Telephone Encounter (Signed)
Patient calling & wanting to know if he can TOC to [place]  He states he used to see Carmelia Roller in the past

## 2021-03-15 NOTE — Telephone Encounter (Signed)
Okay to schedule transfer of care appt w/ Dr. Carmelia Roller. Thank you.

## 2021-03-28 ENCOUNTER — Encounter: Payer: Self-pay | Admitting: Family Medicine

## 2021-03-28 ENCOUNTER — Other Ambulatory Visit: Payer: Self-pay

## 2021-03-28 ENCOUNTER — Other Ambulatory Visit (HOSPITAL_COMMUNITY): Payer: Self-pay

## 2021-03-28 ENCOUNTER — Ambulatory Visit: Payer: 59 | Admitting: Family Medicine

## 2021-03-28 VITALS — BP 140/84 | HR 80 | Temp 97.8°F | Ht 72.0 in | Wt 223.4 lb

## 2021-03-28 DIAGNOSIS — R030 Elevated blood-pressure reading, without diagnosis of hypertension: Secondary | ICD-10-CM | POA: Diagnosis not present

## 2021-03-28 DIAGNOSIS — Z125 Encounter for screening for malignant neoplasm of prostate: Secondary | ICD-10-CM

## 2021-03-28 DIAGNOSIS — Z0000 Encounter for general adult medical examination without abnormal findings: Secondary | ICD-10-CM

## 2021-03-28 MED ORDER — PRAVASTATIN SODIUM 10 MG PO TABS
10.0000 mg | ORAL_TABLET | Freq: Every day | ORAL | 2 refills | Status: DC
Start: 2021-03-28 — End: 2021-05-07
  Filled 2021-03-28: qty 90, 90d supply, fill #0

## 2021-03-28 NOTE — Patient Instructions (Addendum)
Give [place] 2-3 business days to get the results of your labs back.   Keep the diet clean and stay active.  The new Shingrix vaccine (for shingles) is a 2 shot series. It can make people feel low energy, achy and almost like they have the flu for 48 hours after injection. Please plan accordingly when deciding on when to get this shot. Call our office for a nurse visit appointment to get this. The second shot of the series is less severe regarding the side effects, but it still lasts 48 hours.   Check your blood pressures 2-3 times per week, alternating the time of day you check it. If it is high, considering waiting 1-2 minutes and rechecking. If it gets higher, your anxiety is likely creeping up and we should avoid rechecking. I want it below 140/90 consistently over the next month. If it is not, please schedule an appointment.   Let us know if you need anything.

## 2021-03-28 NOTE — Progress Notes (Signed)
Chief Complaint  Patient presents with  . New Patient (Initial Visit)    Well Male Jason Benson is here for a complete physical.   His last physical was last year.  Current diet: in general, a "healthy" diet.  Current exercise: cycling Weight trend: down some weight from last year Fatigue out of ordinary? No. Seat belt? Yes.    Health maintenance Shingrix- No Colonoscopy- Yes Tetanus- Yes HIV- Yes Hep C- Yes   Past Medical History:  Diagnosis Date  . Hyperlipidemia       Past Surgical History:  Procedure Laterality Date  . VASECTOMY    . WISDOM TOOTH EXTRACTION      Medications  Current Outpatient Medications on File Prior to Visit  Medication Sig Dispense Refill  . aspirin EC 81 MG tablet Take 81 mg by mouth daily.      Allergies No Known Allergies  Family History Family History  Problem Relation Age of Onset  . Diabetes Father   . Heart attack Father 73  . Lung cancer Paternal Uncle   . Stroke Mother 32  . Hyperlipidemia Sister   . Colon polyps Neg Hx   . Colon cancer Neg Hx   . Esophageal cancer Neg Hx   . Rectal cancer Neg Hx   . Stomach cancer Neg Hx     Review of Systems: Constitutional:  no fevers Eye:  no recent significant change in vision Ear/Nose/Mouth/Throat:  Ears:  no hearing loss Nose/Mouth/Throat:  no complaints of nasal congestion, no sore throat Cardiovascular:  no chest pain Respiratory:  no shortness of breath Gastrointestinal:  no change in bowel habits GU:  Male: negative for dysuria, frequency Musculoskeletal/Extremities:  no joint pain Integumentary (Skin/Breast):  no abnormal skin lesions reported Neurologic:  no headaches Endocrine: No unexpected weight changes Hematologic/Lymphatic:  no abnormal bleeding  Exam BP 140/84 (BP Location: Left Arm, Patient Position: Sitting, Cuff Size: Normal)   Pulse 80   Temp 97.8 F (36.6 C) (Oral)   Ht 6' (1.829 m)   Wt 223 lb 6 oz (101.3 kg)   SpO2 99%   BMI 30.30 kg/m   General:  well developed, well nourished, in no apparent distress Skin:  no significant moles, warts, or growths Head:  no masses, lesions, or tenderness Eyes:  pupils equal and round, sclera anicteric without injection Ears:  canals without lesions, TMs shiny without retraction, no obvious effusion, no erythema Nose:  nares patent, septum midline, mucosa normal Throat/Pharynx:  lips and gingiva without lesion; tongue and uvula midline; non-inflamed pharynx; no exudates or postnasal drainage Neck: neck supple without adenopathy, thyromegaly, or masses Cardiac: RRR, no bruits, no LE edema Lungs:  clear to auscultation, breath sounds equal bilaterally, no respiratory distress Abdomen: BS+, soft, non-tender, non-distended, no masses or organomegaly noted Rectal: Deferred Musculoskeletal:  symmetrical muscle groups noted without atrophy or deformity Neuro:  gait normal; deep tendon reflexes normal and symmetric Psych: well oriented with normal range of affect and appropriate judgment/insight  Assessment and Plan  Well adult exam - Plan: CBC, Comprehensive metabolic panel, Lipid panel, PSA  Elevated blood pressure reading   Well 55 y.o. male. Counseled on diet and exercise. Counseled on risks and benefits of prostate cancer screening with PSA. The patient agrees to undergo testing. BP elevated. Monitor at home. If >140/90, he will r/s in 1 mo.  Immunizations, labs, and further orders as above. Follow up 6 mo for med ck. The patient voiced understanding and agreement to the plan.  Jilda Roche [place], DO 03/28/21 1:36 PM

## 2021-03-29 LAB — PSA: PSA: 1.89 ng/mL (ref 0.10–4.00)

## 2021-03-29 LAB — CBC
HCT: 43.8 % (ref 39.0–52.0)
Hemoglobin: 15.1 g/dL (ref 13.0–17.0)
MCHC: 34.4 g/dL (ref 30.0–36.0)
MCV: 92.1 fl (ref 78.0–100.0)
Platelets: 224 10*3/uL (ref 150.0–400.0)
RBC: 4.76 Mil/uL (ref 4.22–5.81)
RDW: 13.1 % (ref 11.5–15.5)
WBC: 5.5 10*3/uL (ref 4.0–10.5)

## 2021-03-30 ENCOUNTER — Other Ambulatory Visit: Payer: Self-pay | Admitting: Family Medicine

## 2021-03-30 DIAGNOSIS — E785 Hyperlipidemia, unspecified: Secondary | ICD-10-CM

## 2021-03-30 DIAGNOSIS — R972 Elevated prostate specific antigen [PSA]: Secondary | ICD-10-CM

## 2021-03-30 LAB — LIPID PANEL
Cholesterol: 188 mg/dL (ref 0–200)
HDL: 34.2 mg/dL — ABNORMAL LOW (ref >39.00)
NonHDL: 153.33
Total CHOL/HDL Ratio: 5
Triglycerides: 295 mg/dL — ABNORMAL HIGH (ref 0.0–149.0)
VLDL: 59 mg/dL — ABNORMAL HIGH (ref 0.0–40.0)

## 2021-03-30 LAB — COMPREHENSIVE METABOLIC PANEL
ALT: 43 U/L (ref 0–53)
AST: 22 U/L (ref 0–37)
Albumin: 4.7 g/dL (ref 3.5–5.2)
Alkaline Phosphatase: 76 U/L (ref 39–117)
BUN: 15 mg/dL (ref 6–23)
CO2: 25 mEq/L (ref 19–32)
Calcium: 9.7 mg/dL (ref 8.4–10.5)
Chloride: 104 mEq/L (ref 96–112)
Creatinine, Ser: 0.89 mg/dL (ref 0.40–1.50)
GFR: 98.51 mL/min (ref >60.00)
Glucose, Bld: 90 mg/dL (ref 70–99)
Potassium: 4.1 mEq/L (ref 3.5–5.1)
Sodium: 141 mEq/L (ref 135–145)
Total Bilirubin: 0.4 mg/dL (ref 0.2–1.2)
Total Protein: 7.3 g/dL (ref 6.0–8.3)

## 2021-03-30 LAB — LDL CHOLESTEROL, DIRECT: Direct LDL: 113 mg/dL

## 2021-04-27 ENCOUNTER — Encounter: Payer: 59 | Admitting: Family Medicine

## 2021-05-04 ENCOUNTER — Other Ambulatory Visit (INDEPENDENT_AMBULATORY_CARE_PROVIDER_SITE_OTHER): Payer: 59

## 2021-05-04 ENCOUNTER — Other Ambulatory Visit: Payer: Self-pay

## 2021-05-04 DIAGNOSIS — R972 Elevated prostate specific antigen [PSA]: Secondary | ICD-10-CM | POA: Diagnosis not present

## 2021-05-04 DIAGNOSIS — E785 Hyperlipidemia, unspecified: Secondary | ICD-10-CM

## 2021-05-04 LAB — PSA: PSA: 1.75 ng/mL (ref 0.10–4.00)

## 2021-05-04 LAB — LIPID PANEL
Cholesterol: 207 mg/dL — ABNORMAL HIGH (ref 0–200)
HDL: 31.3 mg/dL — ABNORMAL LOW (ref >39.00)
NonHDL: 175.65
Total CHOL/HDL Ratio: 7
Triglycerides: 247 mg/dL — ABNORMAL HIGH (ref 0.0–149.0)
VLDL: 49.4 mg/dL — ABNORMAL HIGH (ref 0.0–40.0)

## 2021-05-04 LAB — LDL CHOLESTEROL, DIRECT: Direct LDL: 132 mg/dL

## 2021-05-07 ENCOUNTER — Other Ambulatory Visit: Payer: Self-pay | Admitting: Family Medicine

## 2021-05-07 ENCOUNTER — Other Ambulatory Visit (HOSPITAL_COMMUNITY): Payer: Self-pay

## 2021-05-07 DIAGNOSIS — E785 Hyperlipidemia, unspecified: Secondary | ICD-10-CM

## 2021-05-07 MED ORDER — PRAVASTATIN SODIUM 20 MG PO TABS
20.0000 mg | ORAL_TABLET | Freq: Every day | ORAL | 2 refills | Status: DC
  Filled 2021-05-07: qty 30, 30d supply, fill #0
  Filled 2021-06-23: qty 30, 30d supply, fill #1
  Filled 2021-07-20: qty 30, 30d supply, fill #2

## 2021-05-07 NOTE — Progress Notes (Signed)
Lipid panel 

## 2021-06-18 ENCOUNTER — Other Ambulatory Visit: Payer: Self-pay | Admitting: Family Medicine

## 2021-06-18 ENCOUNTER — Other Ambulatory Visit: Payer: Self-pay

## 2021-06-18 ENCOUNTER — Other Ambulatory Visit (HOSPITAL_COMMUNITY): Payer: Self-pay

## 2021-06-18 ENCOUNTER — Other Ambulatory Visit (INDEPENDENT_AMBULATORY_CARE_PROVIDER_SITE_OTHER): Payer: 59

## 2021-06-18 DIAGNOSIS — E785 Hyperlipidemia, unspecified: Secondary | ICD-10-CM | POA: Diagnosis not present

## 2021-06-18 LAB — HEPATIC FUNCTION PANEL
ALT: 44 U/L (ref 0–53)
AST: 21 U/L (ref 0–37)
Albumin: 4.5 g/dL (ref 3.5–5.2)
Alkaline Phosphatase: 59 U/L (ref 39–117)
Bilirubin, Direct: 0.1 mg/dL (ref 0.0–0.3)
Total Bilirubin: 0.6 mg/dL (ref 0.2–1.2)
Total Protein: 7.2 g/dL (ref 6.0–8.3)

## 2021-06-18 LAB — LIPID PANEL
Cholesterol: 185 mg/dL (ref 0–200)
HDL: 29.6 mg/dL — ABNORMAL LOW (ref >39.00)
NonHDL: 155.57
Total CHOL/HDL Ratio: 6
Triglycerides: 234 mg/dL — ABNORMAL HIGH (ref 0.0–149.0)
VLDL: 46.8 mg/dL — ABNORMAL HIGH (ref 0.0–40.0)

## 2021-06-18 LAB — LDL CHOLESTEROL, DIRECT: Direct LDL: 123 mg/dL

## 2021-06-18 MED ORDER — FENOFIBRATE 48 MG PO TABS
48.0000 mg | ORAL_TABLET | Freq: Every day | ORAL | 3 refills | Status: DC
  Filled 2021-06-18: qty 30, 30d supply, fill #0
  Filled 2021-07-20: qty 30, 30d supply, fill #1
  Filled 2021-08-21: qty 30, 30d supply, fill #2
  Filled 2021-10-04: qty 30, 30d supply, fill #3

## 2021-06-23 ENCOUNTER — Other Ambulatory Visit (HOSPITAL_COMMUNITY): Payer: Self-pay

## 2021-07-10 ENCOUNTER — Telehealth (INDEPENDENT_AMBULATORY_CARE_PROVIDER_SITE_OTHER): Payer: 59 | Admitting: Family Medicine

## 2021-07-10 ENCOUNTER — Encounter: Payer: Self-pay | Admitting: Family Medicine

## 2021-07-10 ENCOUNTER — Other Ambulatory Visit (HOSPITAL_COMMUNITY): Payer: Self-pay

## 2021-07-10 ENCOUNTER — Other Ambulatory Visit: Payer: Self-pay

## 2021-07-10 VITALS — Temp 99.0°F

## 2021-07-10 DIAGNOSIS — J069 Acute upper respiratory infection, unspecified: Secondary | ICD-10-CM

## 2021-07-10 MED ORDER — BENZONATATE 100 MG PO CAPS
100.0000 mg | ORAL_CAPSULE | Freq: Three times a day (TID) | ORAL | 0 refills | Status: DC | PRN
  Filled 2021-07-10: qty 30, 10d supply, fill #0

## 2021-07-10 MED ORDER — PREDNISONE 20 MG PO TABS
40.0000 mg | ORAL_TABLET | Freq: Every day | ORAL | 0 refills | Status: AC
  Filled 2021-07-10: qty 10, 5d supply, fill #0

## 2021-07-10 NOTE — Progress Notes (Addendum)
Chief Complaint  Patient presents with   Cough   Sore Throat    Negative Covid test. Congestion Sinus congestion    Orion A Kleinman here for URI complaints. Due to COVID-19 pandemic, we are interacting via web portal for an electronic face-to-face visit. I verified patient's ID using 2 identifiers. Patient agreed to proceed with visit via this method. Patient is at work, I am at office. Patient and I are present for visit.   Duration: 2 days  Associated symptoms: sinus congestion, sinus pain, rhinorrhea, sore throat, myalgia, low grade fevers 99 F, and cough Denies: itchy watery eyes, ear pain, ear drainage, wheezing, shortness of breath, and N/V/D, loss of taste/smell Treatment to date: Aleve, regular meds, cough drops Sick contacts: Yes; son Covid test neg.   Past Medical History:  Diagnosis Date   Hyperlipidemia     Objective Temp 99 F (37.2 C) (Oral)  No conversational dyspnea Age appropriate judgment and insight Nml affect and mood  Viral URI with cough - Plan: predniSONE (DELTASONE) 20 MG tablet, benzonatate (TESSALON) 100 MG capsule  5 d pred burst for sinus pain. Benzonatate prn.  Continue to push fluids, practice good hand hygiene, cover mouth when coughing. No Aleve while on pred.  F/u prn. If starting to experience fevers, shaking, or shortness of breath, seek immediate care. Pt voiced understanding and agreement to the plan.  Jilda Roche [place], DO 07/10/21 3:15 PM

## 2021-07-12 IMAGING — DX DG FOREARM 2V*L*
2 series · 2 of 2 positions shown · non-contrast
Comparison: None.

CLINICAL DATA: Left wrist pain after fall 3 weeks ago.

EXAM:
LEFT FOREARM - 2 VIEW

[forearm ap]
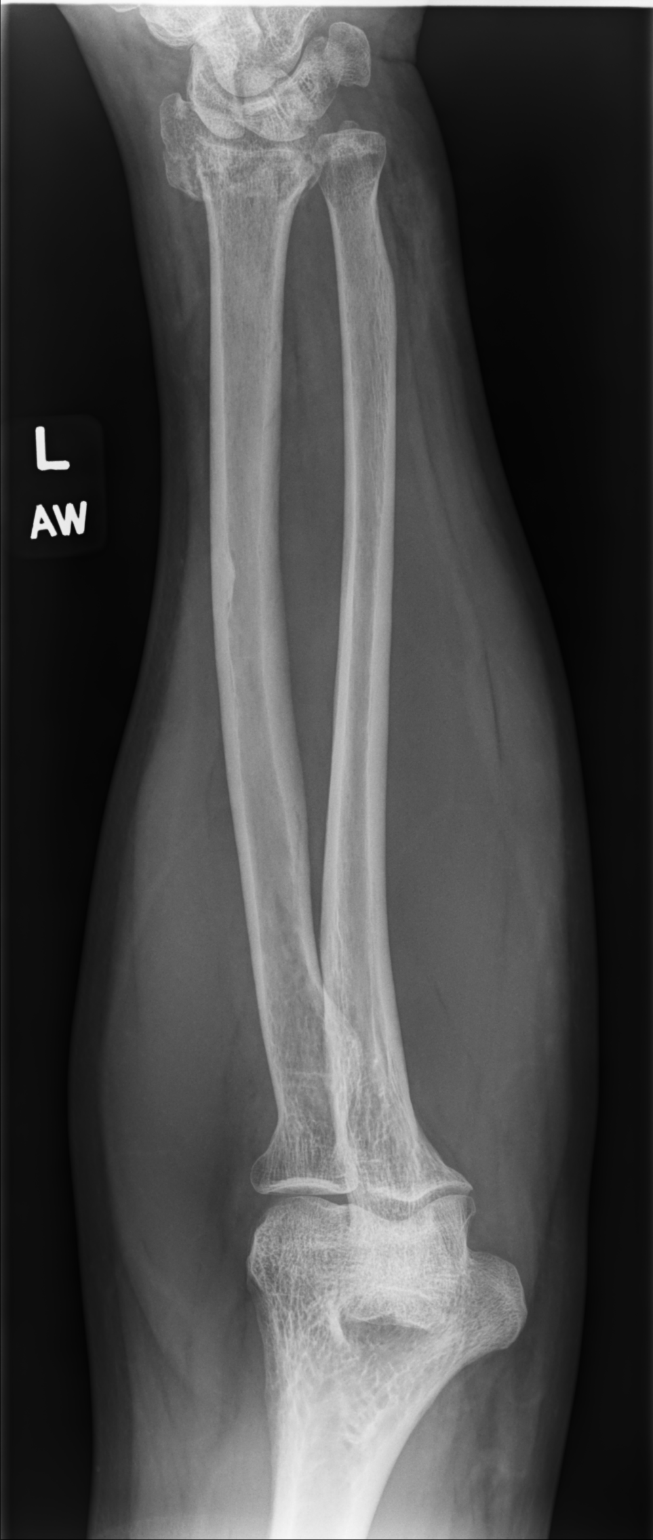

[forearm lat]
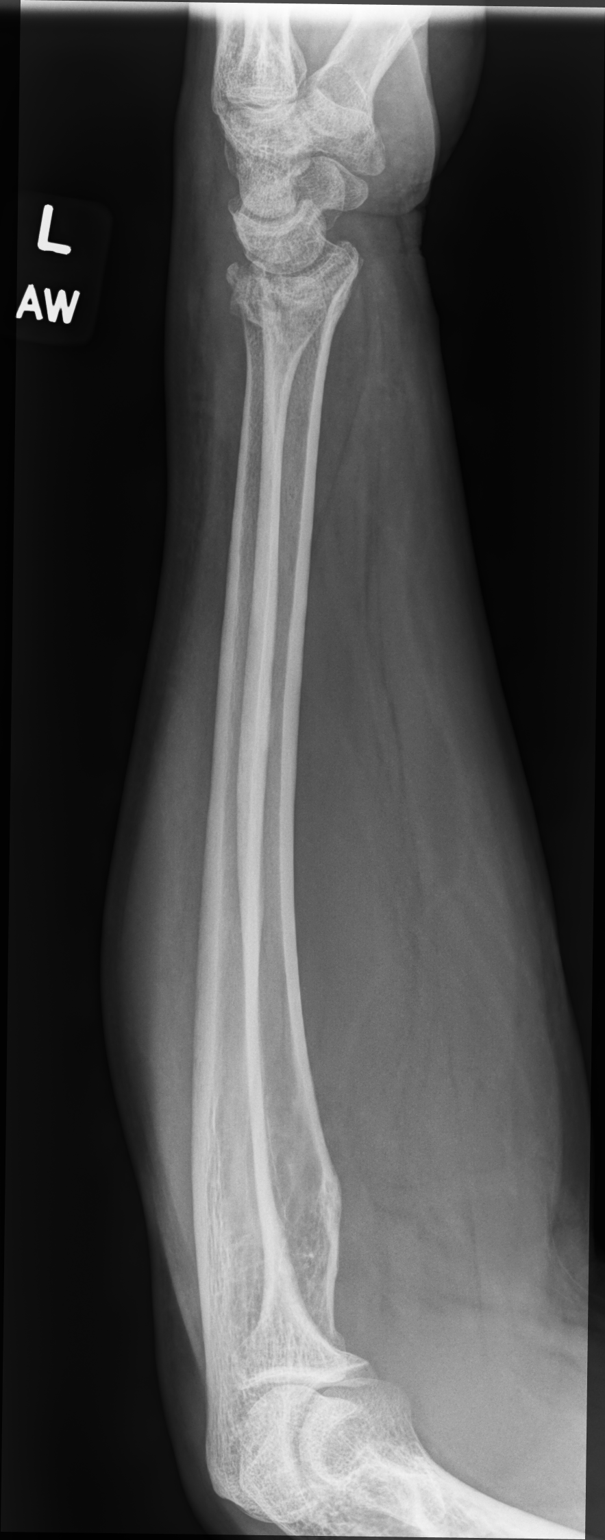

[2 of 2 positions shown; findings below may reference images not displayed]

FINDINGS: Moderately displaced and comminuted distal left radial fracture is
noted with intra-articular extension. The ulna is unremarkable. No
soft tissue abnormality is noted.
IMPRESSION: Moderately displaced and comminuted distal left radial fracture.

## 2021-07-12 IMAGING — DX DG WRIST COMPLETE 3+V*L*
4 series · 4 of 4 positions shown · non-contrast
Comparison: None.

CLINICAL DATA: Left wrist pain after fall 3 weeks ago.

EXAM:
LEFT WRIST - COMPLETE 3+ VIEW

[wrist pa]
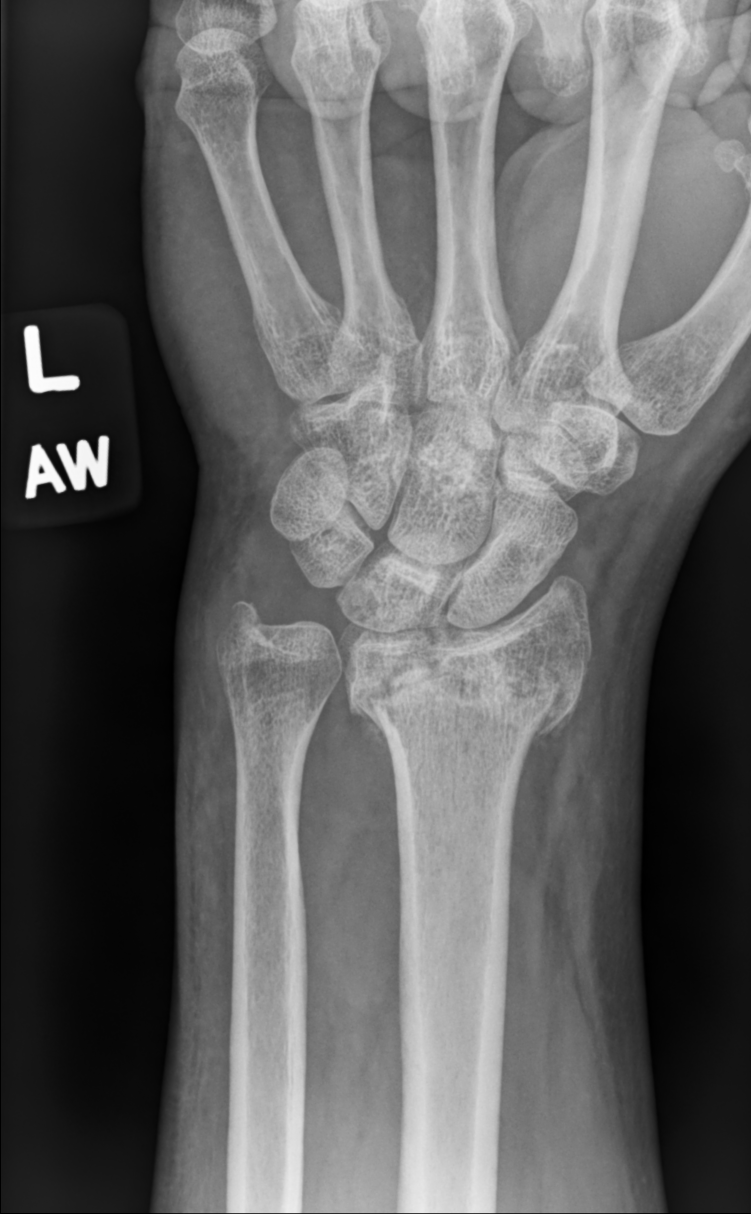

[wrist mlo]
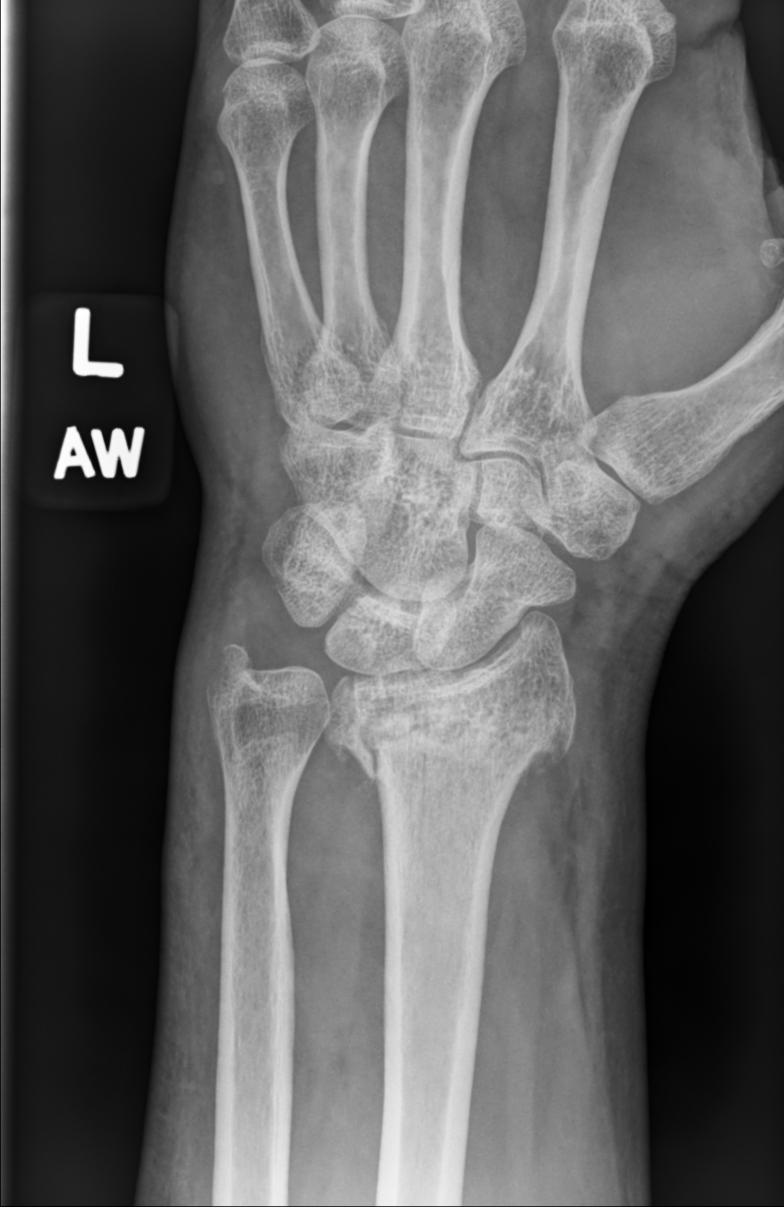

[wrist lat]
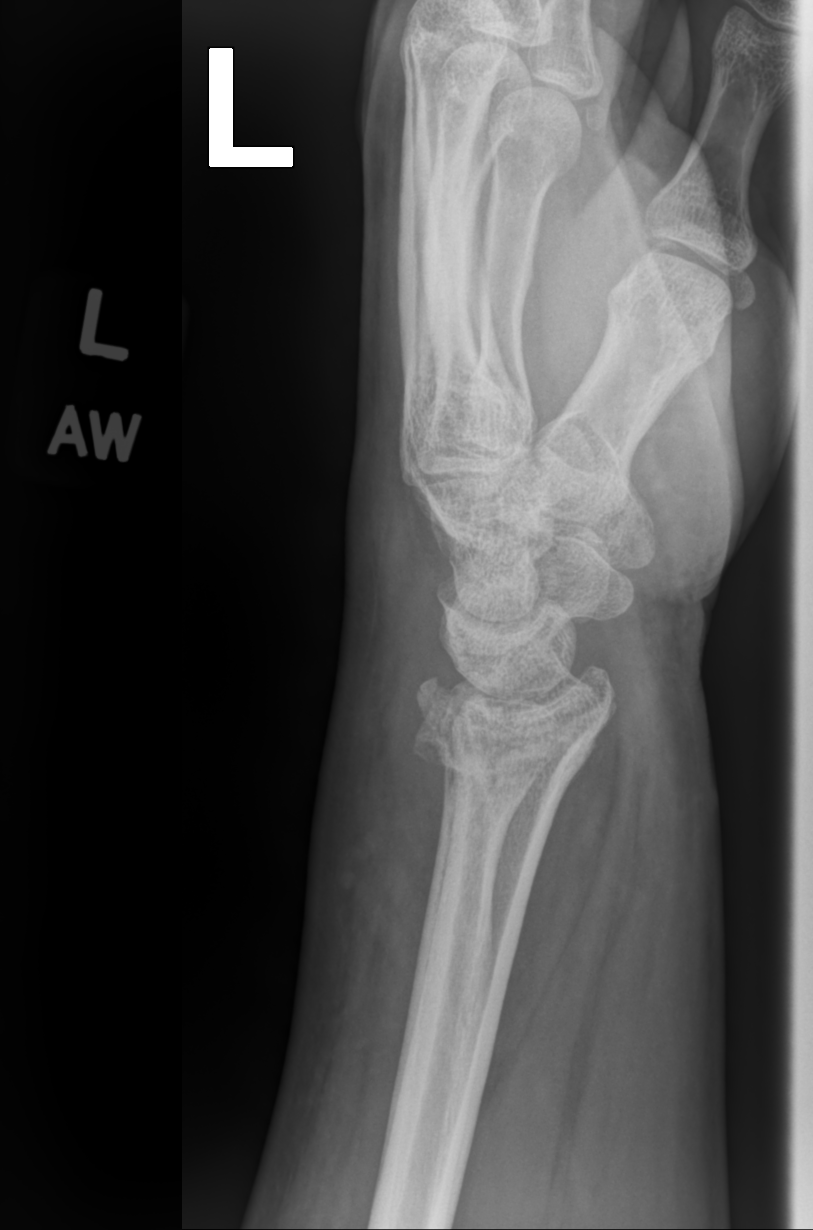

[wrist (navicular) pa]
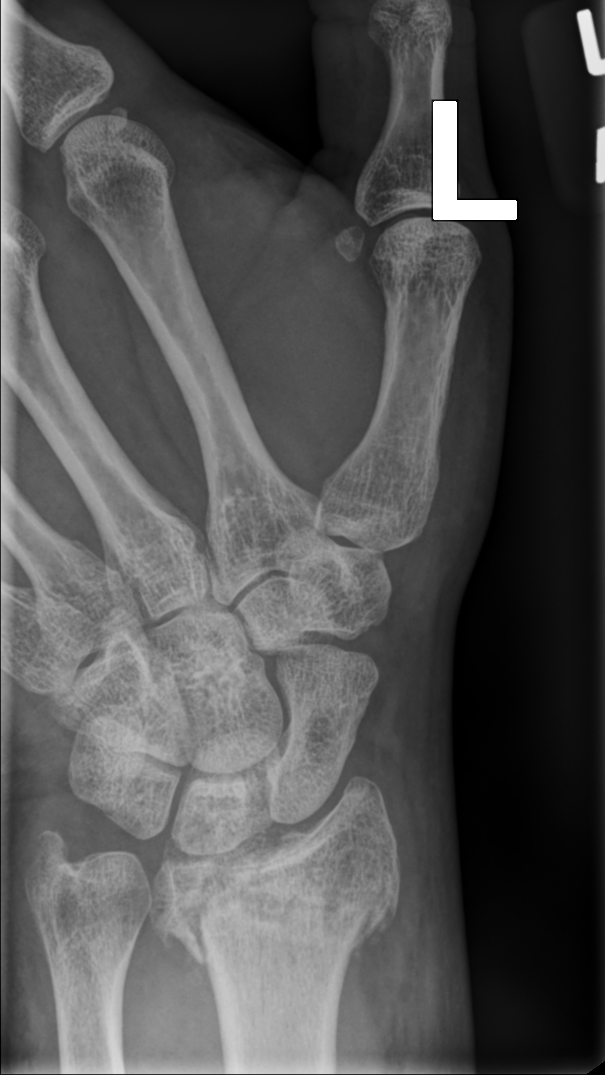

[4 of 4 positions shown; findings below may reference images not displayed]

FINDINGS: Moderately displaced and comminuted fracture is seen involving the
distal left radius with intra-articular extension. Soft tissues are
unremarkable. No other bony abnormality is noted.
IMPRESSION: Moderately displaced and comminuted distal left radial fracture with
intra-articular extension.

## 2021-07-20 ENCOUNTER — Other Ambulatory Visit (HOSPITAL_COMMUNITY): Payer: Self-pay

## 2021-08-07 ENCOUNTER — Other Ambulatory Visit: Payer: Self-pay

## 2021-08-07 ENCOUNTER — Other Ambulatory Visit (INDEPENDENT_AMBULATORY_CARE_PROVIDER_SITE_OTHER): Payer: 59

## 2021-08-07 DIAGNOSIS — E785 Hyperlipidemia, unspecified: Secondary | ICD-10-CM

## 2021-08-07 LAB — LIPID PANEL
Cholesterol: 164 mg/dL (ref 0–200)
HDL: 33.7 mg/dL — ABNORMAL LOW (ref >39.00)
LDL Cholesterol: 98 mg/dL (ref 0–99)
NonHDL: 130.76
Total CHOL/HDL Ratio: 5
Triglycerides: 164 mg/dL — ABNORMAL HIGH (ref 0.0–149.0)
VLDL: 32.8 mg/dL (ref 0.0–40.0)

## 2021-08-21 ENCOUNTER — Other Ambulatory Visit: Payer: Self-pay | Admitting: Family Medicine

## 2021-08-21 ENCOUNTER — Other Ambulatory Visit (HOSPITAL_COMMUNITY): Payer: Self-pay

## 2021-08-21 MED ORDER — PRAVASTATIN SODIUM 20 MG PO TABS
20.0000 mg | ORAL_TABLET | Freq: Every day | ORAL | 3 refills | Status: DC
  Filled 2021-08-21: qty 90, 90d supply, fill #0
  Filled 2021-10-04 – 2021-11-12 (×2): qty 90, 90d supply, fill #1
  Filled 2022-03-12: qty 90, 90d supply, fill #2
  Filled 2022-06-05: qty 90, 90d supply, fill #3

## 2021-10-04 ENCOUNTER — Other Ambulatory Visit (HOSPITAL_COMMUNITY): Payer: Self-pay

## 2021-10-09 ENCOUNTER — Other Ambulatory Visit: Payer: Self-pay | Admitting: Family Medicine

## 2021-10-09 ENCOUNTER — Encounter: Payer: Self-pay | Admitting: Family Medicine

## 2021-10-09 ENCOUNTER — Other Ambulatory Visit (HOSPITAL_COMMUNITY): Payer: Self-pay

## 2021-10-09 ENCOUNTER — Ambulatory Visit: Payer: 59 | Admitting: Family Medicine

## 2021-10-09 VITALS — BP 126/80 | HR 80 | Temp 98.1°F | Ht 72.0 in | Wt 224.1 lb

## 2021-10-09 DIAGNOSIS — E782 Mixed hyperlipidemia: Secondary | ICD-10-CM | POA: Diagnosis not present

## 2021-10-09 DIAGNOSIS — I10 Essential (primary) hypertension: Secondary | ICD-10-CM

## 2021-10-09 DIAGNOSIS — Z23 Encounter for immunization: Secondary | ICD-10-CM

## 2021-10-09 DIAGNOSIS — R413 Other amnesia: Secondary | ICD-10-CM | POA: Diagnosis not present

## 2021-10-09 DIAGNOSIS — E785 Hyperlipidemia, unspecified: Secondary | ICD-10-CM

## 2021-10-09 LAB — LDL CHOLESTEROL, DIRECT: Direct LDL: 129 mg/dL

## 2021-10-09 LAB — COMPREHENSIVE METABOLIC PANEL
ALT: 39 U/L (ref 0–53)
AST: 19 U/L (ref 0–37)
Albumin: 4.6 g/dL (ref 3.5–5.2)
Alkaline Phosphatase: 60 U/L (ref 39–117)
BUN: 11 mg/dL (ref 6–23)
CO2: 30 mEq/L (ref 19–32)
Calcium: 9.7 mg/dL (ref 8.4–10.5)
Chloride: 102 mEq/L (ref 96–112)
Creatinine, Ser: 0.89 mg/dL (ref 0.40–1.50)
GFR: 98.14 mL/min (ref >60.00)
Glucose, Bld: 85 mg/dL (ref 70–99)
Potassium: 4.3 mEq/L (ref 3.5–5.1)
Sodium: 139 mEq/L (ref 135–145)
Total Bilirubin: 0.4 mg/dL (ref 0.2–1.2)
Total Protein: 7.3 g/dL (ref 6.0–8.3)

## 2021-10-09 LAB — LIPID PANEL
Cholesterol: 189 mg/dL (ref 0–200)
HDL: 31.4 mg/dL — ABNORMAL LOW (ref >39.00)
NonHDL: 157.56
Total CHOL/HDL Ratio: 6
Triglycerides: 258 mg/dL — ABNORMAL HIGH (ref 0.0–149.0)
VLDL: 51.6 mg/dL — ABNORMAL HIGH (ref 0.0–40.0)

## 2021-10-09 LAB — TSH: TSH: 1.06 u[IU]/mL (ref 0.35–5.50)

## 2021-10-09 LAB — VITAMIN B12: Vitamin B-12: 337 pg/mL (ref 211–911)

## 2021-10-09 LAB — CBC
HCT: 44 % (ref 39.0–52.0)
Hemoglobin: 15.2 g/dL (ref 13.0–17.0)
MCHC: 34.5 g/dL (ref 30.0–36.0)
MCV: 92.8 fl (ref 78.0–100.0)
Platelets: 242 10*3/uL (ref 150.0–400.0)
RBC: 4.74 Mil/uL (ref 4.22–5.81)
RDW: 13.2 % (ref 11.5–15.5)
WBC: 6.3 10*3/uL (ref 4.0–10.5)

## 2021-10-09 LAB — VITAMIN D 25 HYDROXY (VIT D DEFICIENCY, FRACTURES): VITD: 11.38 ng/mL — ABNORMAL LOW (ref 30.00–100.00)

## 2021-10-09 MED ORDER — FENOFIBRATE 145 MG PO TABS
145.0000 mg | ORAL_TABLET | Freq: Every day | ORAL | 2 refills | Status: DC
  Filled 2021-10-09: qty 30, 30d supply, fill #0
  Filled 2021-11-12: qty 30, 30d supply, fill #1
  Filled 2021-12-17: qty 30, 30d supply, fill #2

## 2021-10-09 NOTE — Progress Notes (Signed)
labs

## 2021-10-09 NOTE — Patient Instructions (Addendum)
Give [place] 2-3 business days to get the results of your labs back.   Keep the diet clean and stay active.  Aim to do some physical exertion for 150 minutes per week. This is typically divided into 5 days per week, 30 minutes per day. The activity should be enough to get your heart rate up. Anything is better than nothing if you have time constraints.  Try to get 7 hours of sleep nightly.   Please consider counseling. Contact [phone] to schedule an appointment or inquire about cost/insurance coverage.  [facility] located at [address], [address], [place], [place].  Phone number = [phone].  Dr. Regan Lemming - Adult Psychiatry.    [facility] located at [address] [place], [place], [place]. Phone number = [phone].   [facility] located at [address], [place], [place].  Phone number = [phone].   [facility] located at [address] [place], [place], [place].  Phone number = [phone].  Coping skills Choose 5 that work for you: Take a deep breath Count to 20 Read a book Do a puzzle Meditate Bake Sing Knit Garden Pray Go outside Call a friend Listen to music Take a walk Color Send a note Take a bath Watch a movie Be alone in a quiet place Pet an animal Visit a friend Journal Exercise Stretch   Let us know if you need anything.

## 2021-10-09 NOTE — Progress Notes (Signed)
Chief Complaint  Patient presents with   Follow-up    Subjective: Hyperlipidemia Patient presents for Hyperlipidemia follow up. Currently taking pravastatin 40 mg/d, fenofibrate 48 mg/d and compliance with treatment thus far has been good. He denies myalgias. He is adhering to a healthy diet. Exercise: walking The patient is not known to have coexisting coronary artery disease.  Memory issues- Having trouble remembering things over the past several mo. Mood is stable. Anxiety troubling a bit. Usually wakes up at 6, sometimes will wake up at 4 w racing thoughts and unable to fall back asleep. Diet/exercise as above. No hx of dementia in family.   Past Medical History:  Diagnosis Date   Hyperlipidemia     Objective: BP 126/80    Pulse 80    Temp 98.1 F (36.7 C) (Oral)    Ht 6' (1.829 m)    Wt 224 lb 2 oz (101.7 kg)    SpO2 99%    BMI 30.40 kg/m  General: Awake, appears stated age HEENT: MMM Heart: RRR, no LE edema, no bruits Lungs: CTAB, no rales, wheezes or rhonchi. No accessory muscle use Psych: Age appropriate judgment and insight, normal affect and mood  Assessment and Plan: Mixed hyperlipidemia - Plan: Comprehensive metabolic panel, Lipid panel  Memory change - Plan: CBC, TSH, VITAMIN D 25 Hydroxy (Vit-D Deficiency, Fractures), B12  Chronic, stable. Cont Tricor 48 mg/d, Pravachol 40 mg/d. Ck labs. Counseled on diet/exercise. New problem, uncertain prog, workup planned. 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 If labs are normal, he could pursue counseling which he was provided w resources on paperwork.  F/u in 6 mo. The patient voiced understanding and agreement to the plan.  Jilda Roche [place], DO 10/09/21  1:17 PM

## 2021-10-10 ENCOUNTER — Other Ambulatory Visit (HOSPITAL_COMMUNITY): Payer: Self-pay

## 2021-11-12 ENCOUNTER — Other Ambulatory Visit (HOSPITAL_COMMUNITY): Payer: Self-pay

## 2021-11-12 ENCOUNTER — Encounter: Payer: Self-pay | Admitting: Family Medicine

## 2021-11-12 ENCOUNTER — Other Ambulatory Visit (INDEPENDENT_AMBULATORY_CARE_PROVIDER_SITE_OTHER): Payer: 59

## 2021-11-12 DIAGNOSIS — E785 Hyperlipidemia, unspecified: Secondary | ICD-10-CM | POA: Diagnosis not present

## 2021-11-12 DIAGNOSIS — E782 Mixed hyperlipidemia: Secondary | ICD-10-CM

## 2021-11-12 LAB — LIPID PANEL
Cholesterol: 168 mg/dL (ref 0–200)
HDL: 30.7 mg/dL — ABNORMAL LOW (ref >39.00)
LDL Cholesterol: 103 mg/dL — ABNORMAL HIGH (ref 0–99)
NonHDL: 137.06
Total CHOL/HDL Ratio: 5
Triglycerides: 169 mg/dL — ABNORMAL HIGH (ref 0.0–149.0)
VLDL: 33.8 mg/dL (ref 0.0–40.0)

## 2021-11-12 LAB — HEPATIC FUNCTION PANEL
ALT: 23 U/L (ref 0–53)
AST: 13 U/L (ref 0–37)
Albumin: 4.4 g/dL (ref 3.5–5.2)
Alkaline Phosphatase: 57 U/L (ref 39–117)
Bilirubin, Direct: 0.1 mg/dL (ref 0.0–0.3)
Total Bilirubin: 0.6 mg/dL (ref 0.2–1.2)
Total Protein: 6.8 g/dL (ref 6.0–8.3)

## 2021-12-18 ENCOUNTER — Other Ambulatory Visit (HOSPITAL_COMMUNITY): Payer: Self-pay

## 2021-12-19 ENCOUNTER — Other Ambulatory Visit (HOSPITAL_COMMUNITY): Payer: Self-pay

## 2022-01-20 ENCOUNTER — Other Ambulatory Visit: Payer: Self-pay | Admitting: Family Medicine

## 2022-01-21 ENCOUNTER — Other Ambulatory Visit (HOSPITAL_COMMUNITY): Payer: Self-pay

## 2022-01-21 MED ORDER — FENOFIBRATE 145 MG PO TABS
145.0000 mg | ORAL_TABLET | Freq: Every day | ORAL | 2 refills | Status: DC
  Filled 2022-01-21: qty 30, 30d supply, fill #0
  Filled 2022-03-12: qty 30, 30d supply, fill #1
  Filled 2022-04-25: qty 30, 30d supply, fill #2

## 2022-03-12 ENCOUNTER — Other Ambulatory Visit (HOSPITAL_COMMUNITY): Payer: Self-pay

## 2022-03-13 ENCOUNTER — Telehealth: Payer: Self-pay | Admitting: Family Medicine

## 2022-03-13 NOTE — Telephone Encounter (Signed)
Called the patient informed of PCP instructions regarding getting these vaccines. ?He stated he would go to our pharmacy downstairs to get them both  ?

## 2022-03-13 NOTE — Telephone Encounter (Signed)
Pt would like to have shingle and covid shot ? ?Please advise  ?

## 2022-04-26 ENCOUNTER — Other Ambulatory Visit (HOSPITAL_COMMUNITY): Payer: Self-pay

## 2022-04-29 ENCOUNTER — Other Ambulatory Visit (HOSPITAL_COMMUNITY): Payer: Self-pay

## 2022-04-29 MED ORDER — SHINGRIX 50 MCG/0.5ML IM SUSR
INTRAMUSCULAR | 1 refills | Status: DC
  Filled 2022-04-29 – 2022-06-18 (×2): qty 0.5, 1d supply, fill #0

## 2022-05-17 ENCOUNTER — Other Ambulatory Visit (HOSPITAL_COMMUNITY): Payer: Self-pay

## 2022-06-05 ENCOUNTER — Other Ambulatory Visit: Payer: Self-pay | Admitting: Family Medicine

## 2022-06-06 ENCOUNTER — Other Ambulatory Visit (HOSPITAL_COMMUNITY): Payer: Self-pay

## 2022-06-06 MED ORDER — FENOFIBRATE 145 MG PO TABS
145.0000 mg | ORAL_TABLET | Freq: Every day | ORAL | 2 refills | Status: DC
  Filled 2022-06-06: qty 30, 30d supply, fill #0
  Filled 2022-07-10: qty 30, 30d supply, fill #1
  Filled 2022-08-12: qty 30, 30d supply, fill #2

## 2022-06-06 MED ORDER — ASPIRIN EC 81 MG PO TBEC
81.0000 mg | DELAYED_RELEASE_TABLET | Freq: Every day | ORAL | 1 refills | Status: AC
  Filled 2022-06-06: qty 30, 30d supply, fill #0

## 2022-06-18 ENCOUNTER — Other Ambulatory Visit (HOSPITAL_COMMUNITY): Payer: Self-pay

## 2022-06-21 ENCOUNTER — Other Ambulatory Visit (HOSPITAL_COMMUNITY): Payer: Self-pay

## 2022-07-10 ENCOUNTER — Other Ambulatory Visit (HOSPITAL_COMMUNITY): Payer: Self-pay

## 2022-08-12 ENCOUNTER — Other Ambulatory Visit (HOSPITAL_COMMUNITY): Payer: Self-pay

## 2022-08-12 ENCOUNTER — Other Ambulatory Visit: Payer: Self-pay | Admitting: Family Medicine

## 2022-08-12 MED ORDER — PRAVASTATIN SODIUM 20 MG PO TABS
20.0000 mg | ORAL_TABLET | Freq: Every day | ORAL | 0 refills | Status: DC
  Filled 2022-08-12 – 2022-08-16 (×2): qty 90, 90d supply, fill #0

## 2022-08-14 DIAGNOSIS — H524 Presbyopia: Secondary | ICD-10-CM | POA: Diagnosis not present

## 2022-08-16 ENCOUNTER — Other Ambulatory Visit (HOSPITAL_COMMUNITY): Payer: Self-pay

## 2022-08-28 ENCOUNTER — Other Ambulatory Visit (INDEPENDENT_AMBULATORY_CARE_PROVIDER_SITE_OTHER): Payer: 59

## 2022-08-28 ENCOUNTER — Encounter: Payer: Self-pay | Admitting: Family Medicine

## 2022-08-28 ENCOUNTER — Other Ambulatory Visit: Payer: Self-pay | Admitting: Family Medicine

## 2022-08-28 ENCOUNTER — Ambulatory Visit (INDEPENDENT_AMBULATORY_CARE_PROVIDER_SITE_OTHER): Payer: 59 | Admitting: Family Medicine

## 2022-08-28 VITALS — BP 124/80 | HR 65 | Temp 98.4°F | Ht 72.0 in | Wt 224.1 lb

## 2022-08-28 DIAGNOSIS — Z125 Encounter for screening for malignant neoplasm of prostate: Secondary | ICD-10-CM

## 2022-08-28 DIAGNOSIS — R739 Hyperglycemia, unspecified: Secondary | ICD-10-CM

## 2022-08-28 DIAGNOSIS — Z23 Encounter for immunization: Secondary | ICD-10-CM | POA: Diagnosis not present

## 2022-08-28 DIAGNOSIS — Z0000 Encounter for general adult medical examination without abnormal findings: Secondary | ICD-10-CM

## 2022-08-28 LAB — COMPREHENSIVE METABOLIC PANEL
ALT: 36 U/L (ref 0–53)
AST: 19 U/L (ref 0–37)
Albumin: 4.6 g/dL (ref 3.5–5.2)
Alkaline Phosphatase: 52 U/L (ref 39–117)
BUN: 12 mg/dL (ref 6–23)
CO2: 31 mEq/L (ref 19–32)
Calcium: 9.6 mg/dL (ref 8.4–10.5)
Chloride: 102 mEq/L (ref 96–112)
Creatinine, Ser: 0.96 mg/dL (ref 0.40–1.50)
GFR: 89.96 mL/min (ref >60.00)
Glucose, Bld: 108 mg/dL — ABNORMAL HIGH (ref 70–99)
Potassium: 4.2 mEq/L (ref 3.5–5.1)
Sodium: 138 mEq/L (ref 135–145)
Total Bilirubin: 0.5 mg/dL (ref 0.2–1.2)
Total Protein: 7.2 g/dL (ref 6.0–8.3)

## 2022-08-28 LAB — LIPID PANEL
Cholesterol: 172 mg/dL (ref 0–200)
HDL: 34.2 mg/dL — ABNORMAL LOW (ref >39.00)
LDL Cholesterol: 109 mg/dL — ABNORMAL HIGH (ref 0–99)
NonHDL: 138.05
Total CHOL/HDL Ratio: 5
Triglycerides: 147 mg/dL (ref 0.0–149.0)
VLDL: 29.4 mg/dL (ref 0.0–40.0)

## 2022-08-28 LAB — CBC
HCT: 44.3 % (ref 39.0–52.0)
Hemoglobin: 15.3 g/dL (ref 13.0–17.0)
MCHC: 34.5 g/dL (ref 30.0–36.0)
MCV: 92.4 fl (ref 78.0–100.0)
Platelets: 233 10*3/uL (ref 150.0–400.0)
RBC: 4.8 Mil/uL (ref 4.22–5.81)
RDW: 13.5 % (ref 11.5–15.5)
WBC: 5.6 10*3/uL (ref 4.0–10.5)

## 2022-08-28 LAB — HEMOGLOBIN A1C: Hgb A1c MFr Bld: 5.5 % (ref 4.6–6.5)

## 2022-08-28 LAB — PSA: PSA: 1.37 ng/mL (ref 0.10–4.00)

## 2022-08-28 NOTE — Patient Instructions (Signed)
Give us 2-3 business days to get the results of your labs back.   Keep the diet clean and stay active.  Please get me a copy of your advanced directive form at your convenience.   Let us know if you need anything.  

## 2022-08-28 NOTE — Progress Notes (Signed)
Chief Complaint  Patient presents with   Annual Exam    Well Male Jason Benson is here for a complete physical.   His last physical was >1 year ago.  Current diet: in general, a "healthy" diet.  Current exercise: cycling Weight trend: stable Fatigue out of ordinary? No. Seat belt? Yes.   Advanced directive? Yes  Health maintenance Shingrix- Yes Colonoscopy- Yes Tetanus- Yes HIV- Yes Hep C- Yes   Past Medical History:  Diagnosis Date   Hyperlipidemia       Past Surgical History:  Procedure Laterality Date   VASECTOMY     WISDOM TOOTH EXTRACTION      Medications  Current Outpatient Medications on File Prior to Visit  Medication Sig Dispense Refill   aspirin EC 81 MG tablet Take 1 tablet (81 mg total) by mouth daily. 30 tablet 1   fenofibrate (TRICOR) 145 MG tablet Take 1 tablet by mouth daily. 30 tablet 2   pravastatin (PRAVACHOL) 20 MG tablet Take 1 tablet (20 mg total) by mouth daily. 90 tablet 0   Zoster Vaccine Adjuvanted [facility]) injection Inject 0.5 mL into the skin. 0.5 mL 1   Allergies No Known Allergies  Family History Family History  Problem Relation Age of Onset   Diabetes Father    Heart attack Father 41   Lung cancer Paternal Uncle    Stroke Mother 106   Hyperlipidemia Sister    Colon polyps Neg Hx    Colon cancer Neg Hx    Esophageal cancer Neg Hx    Rectal cancer Neg Hx    Stomach cancer Neg Hx     Review of Systems: Constitutional:  no fevers Eye:  no recent significant change in vision Ear/Nose/Mouth/Throat:  Ears:  no hearing loss Nose/Mouth/Throat:  no complaints of nasal congestion, no sore throat Cardiovascular:  no chest pain Respiratory:  no shortness of breath Gastrointestinal:  no change in bowel habits GU:  Male: negative for dysuria, frequency Musculoskeletal/Extremities:  no joint pain Integumentary (Skin/Breast):  no abnormal skin lesions reported Neurologic:  no headaches Endocrine: No unexpected weight  changes Hematologic/Lymphatic:  no abnormal bleeding  Exam BP 124/80 (BP Location: Left Arm, Patient Position: Sitting, Cuff Size: Normal)   Pulse 65   Temp 98.4 F (36.9 C) (Oral)   Ht 6' (1.829 m)   Wt 224 lb 2 oz (101.7 kg)   SpO2 98%   BMI 30.40 kg/m  General:  well developed, well nourished, in no apparent distress Skin:  no significant moles, warts, or growths Head:  no masses, lesions, or tenderness Eyes:  pupils equal and round, sclera anicteric without injection Ears:  canals without lesions, TMs shiny without retraction, no obvious effusion, no erythema Nose:  nares patent, mucosa normal Throat/Pharynx:  lips and gingiva without lesion; tongue and uvula midline; non-inflamed pharynx; no exudates or postnasal drainage Neck: neck supple without adenopathy, thyromegaly, or masses Cardiac: RRR, no bruits, no LE edema Lungs:  clear to auscultation, breath sounds equal bilaterally, no respiratory distress Abdomen: BS+, soft, non-tender, non-distended, no masses or organomegaly noted Rectal: Deferred Musculoskeletal:  symmetrical muscle groups noted without atrophy or deformity Neuro:  gait normal; deep tendon reflexes normal and symmetric Psych: well oriented with normal range of affect and appropriate judgment/insight  Assessment and Plan  Well adult exam - Plan: CBC, Comprehensive metabolic panel, Lipid panel  Screening for prostate cancer - Plan: PSA  Need for influenza vaccination - Plan: Flu Vaccine QUAD 6+ mos PF IM (Fluarix  Quad PF)   Well 56 y.o. male. Counseled on diet and exercise. Advanced directive form requested today.  Counseled on risks and benefits of prostate cancer screening with PSA. The patient agrees to undergo testing. Flu shot today.  Immunizations, labs, and further orders as above. Follow up in 6 mo. The patient voiced understanding and agreement to the plan.  Jilda Roche [place], DO 08/28/22 8:19 AM

## 2022-09-04 ENCOUNTER — Other Ambulatory Visit: Payer: Self-pay | Admitting: Family Medicine

## 2022-09-04 ENCOUNTER — Other Ambulatory Visit (HOSPITAL_COMMUNITY): Payer: Self-pay

## 2022-09-04 MED ORDER — PRAVASTATIN SODIUM 20 MG PO TABS
20.0000 mg | ORAL_TABLET | Freq: Every day | ORAL | 0 refills | Status: DC
  Filled 2022-09-04 – 2022-11-12 (×2): qty 90, 90d supply, fill #0

## 2022-09-04 MED ORDER — FENOFIBRATE 145 MG PO TABS
145.0000 mg | ORAL_TABLET | Freq: Every day | ORAL | 2 refills | Status: DC
  Filled 2022-09-04 – 2022-09-10 (×2): qty 30, 30d supply, fill #0
  Filled 2022-10-09: qty 30, 30d supply, fill #1
  Filled 2022-11-12: qty 30, 30d supply, fill #2

## 2022-09-10 ENCOUNTER — Other Ambulatory Visit (HOSPITAL_COMMUNITY): Payer: Self-pay

## 2022-11-13 ENCOUNTER — Other Ambulatory Visit (HOSPITAL_COMMUNITY): Payer: Self-pay

## 2022-12-06 ENCOUNTER — Other Ambulatory Visit (HOSPITAL_COMMUNITY): Payer: Self-pay

## 2022-12-06 ENCOUNTER — Other Ambulatory Visit: Payer: Self-pay | Admitting: Family Medicine

## 2022-12-06 MED ORDER — PRAVASTATIN SODIUM 20 MG PO TABS
20.0000 mg | ORAL_TABLET | Freq: Every day | ORAL | 1 refills | Status: DC
  Filled 2022-12-06 – 2023-03-20 (×2): qty 90, 90d supply, fill #0
  Filled 2023-07-01: qty 90, 90d supply, fill #1

## 2022-12-06 MED ORDER — FENOFIBRATE 145 MG PO TABS
145.0000 mg | ORAL_TABLET | Freq: Every day | ORAL | 1 refills | Status: DC
  Filled 2022-12-06 – 2022-12-18 (×2): qty 90, 90d supply, fill #0
  Filled 2023-03-20: qty 90, 90d supply, fill #1

## 2022-12-19 ENCOUNTER — Other Ambulatory Visit (HOSPITAL_COMMUNITY): Payer: Self-pay

## 2023-02-26 ENCOUNTER — Other Ambulatory Visit (HOSPITAL_COMMUNITY): Payer: Self-pay

## 2023-02-26 ENCOUNTER — Ambulatory Visit: Payer: Commercial Managed Care - PPO | Admitting: Family Medicine

## 2023-02-26 ENCOUNTER — Other Ambulatory Visit: Payer: Self-pay | Admitting: Family Medicine

## 2023-02-26 ENCOUNTER — Encounter: Payer: Self-pay | Admitting: Family Medicine

## 2023-02-26 VITALS — BP 121/72 | HR 65 | Temp 97.7°F | Ht 72.0 in | Wt 227.4 lb

## 2023-02-26 DIAGNOSIS — E782 Mixed hyperlipidemia: Secondary | ICD-10-CM | POA: Diagnosis not present

## 2023-02-26 DIAGNOSIS — R5383 Other fatigue: Secondary | ICD-10-CM

## 2023-02-26 LAB — CBC
HCT: 43.7 % (ref 39.0–52.0)
Hemoglobin: 15.2 g/dL (ref 13.0–17.0)
MCHC: 34.8 g/dL (ref 30.0–36.0)
MCV: 91.9 fl (ref 78.0–100.0)
Platelets: 233 10*3/uL (ref 150.0–400.0)
RBC: 4.75 Mil/uL (ref 4.22–5.81)
RDW: 13.2 % (ref 11.5–15.5)
WBC: 5.2 10*3/uL (ref 4.0–10.5)

## 2023-02-26 LAB — COMPREHENSIVE METABOLIC PANEL
ALT: 28 U/L (ref 0–53)
AST: 23 U/L (ref 0–37)
Albumin: 4.5 g/dL (ref 3.5–5.2)
Alkaline Phosphatase: 46 U/L (ref 39–117)
BUN: 19 mg/dL (ref 6–23)
CO2: 27 mEq/L (ref 19–32)
Calcium: 9.4 mg/dL (ref 8.4–10.5)
Chloride: 104 mEq/L (ref 96–112)
Creatinine, Ser: 1.06 mg/dL (ref 0.40–1.50)
GFR: 79.6 mL/min (ref >60.00)
Glucose, Bld: 91 mg/dL (ref 70–99)
Potassium: 3.9 mEq/L (ref 3.5–5.1)
Sodium: 139 mEq/L (ref 135–145)
Total Bilirubin: 0.9 mg/dL (ref 0.2–1.2)
Total Protein: 7.1 g/dL (ref 6.0–8.3)

## 2023-02-26 LAB — LIPID PANEL
Cholesterol: 167 mg/dL (ref 0–200)
HDL: 30.8 mg/dL — ABNORMAL LOW (ref >39.00)
LDL Cholesterol: 104 mg/dL — ABNORMAL HIGH (ref 0–99)
NonHDL: 135.86
Total CHOL/HDL Ratio: 5
Triglycerides: 157 mg/dL — ABNORMAL HIGH (ref 0.0–149.0)
VLDL: 31.4 mg/dL (ref 0.0–40.0)

## 2023-02-26 LAB — T4, FREE: Free T4: 0.83 ng/dL (ref 0.60–1.60)

## 2023-02-26 LAB — TSH: TSH: 0.82 u[IU]/mL (ref 0.35–5.50)

## 2023-02-26 LAB — TESTOSTERONE: Testosterone: 183.63 ng/dL — ABNORMAL LOW (ref 300.00–890.00)

## 2023-02-26 LAB — VITAMIN D 25 HYDROXY (VIT D DEFICIENCY, FRACTURES): VITD: 10.63 ng/mL — ABNORMAL LOW (ref 30.00–100.00)

## 2023-02-26 MED ORDER — VITAMIN D (ERGOCALCIFEROL) 1.25 MG (50000 UNIT) PO CAPS
50000.0000 [IU] | ORAL_CAPSULE | ORAL | 0 refills | Status: DC
  Filled 2023-02-26: qty 12, 84d supply, fill #0

## 2023-02-26 NOTE — Progress Notes (Signed)
Chief Complaint  Patient presents with   Follow-up    6 month     Subjective: Hyperlipidemia Patient presents for Hyperlipidemia follow up. Currently taking pravastatin 20 mg/d, Tricor 145 mg/d and compliance with treatment thus far has been good. He denies myalgias. He is adhering to a healthy diet. Exercise: cycling, active at work The patient is not known to have coexisting coronary artery disease. No Cp or SOB.   Over 4-5 fatigue. Sleeps well, feels refreshed. Does not snore at night or wake up gasping for air. He denies any depression/anxiety. Eats around 50 g of protein daily and stays hydrated. Exercise as above.   Past Medical History:  Diagnosis Date   Hyperlipidemia     Objective: BP 121/72 (BP Location: Left Arm, Patient Position: Sitting, Cuff Size: Large)   Pulse 65   Temp 97.7 F (36.5 C) (Oral)   Ht 6' (1.829 m)   Wt 227 lb 6 oz (103.1 kg)   SpO2 94%   BMI 30.84 kg/m  General: Awake, appears stated age HEENT: MMM, Mallampati 3 Heart: RRR, no LE edema, no bruits Lungs: CTAB, no rales, wheezes or rhonchi. No accessory muscle use Psych: Age appropriate judgment and insight, normal affect and mood  Assessment and Plan: Mixed hyperlipidemia - Plan: Lipid panel  Fatigue, unspecified type - Plan: CBC, Comprehensive metabolic panel, T4, free, TSH, VITAMIN D 25 Hydroxy (Vit-D Deficiency, Fractures), Testosterone  Chronic, stable. Cont Tricor 145 mg/d, pravastatin 20 mg/d. Counseled on diet/exercise. Chronic, unstable. Ck above labs. Unlikely to be OSA or anx/depression. May need to increase protein intake. Rec'd 80 g/d.  F/u in 6 mo or prn. The patient voiced understanding and agreement to the plan.  Jilda Roche [place], DO 02/26/23  8:23 AM

## 2023-02-26 NOTE — Patient Instructions (Signed)
Give [place] 2-3 business days to get the results of your labs back.   Keep the diet clean and stay active.  Try to get around 80 g of protein daily.   Let us know if you need anything.

## 2023-03-03 ENCOUNTER — Other Ambulatory Visit: Payer: Self-pay | Admitting: Family Medicine

## 2023-03-03 DIAGNOSIS — R7989 Other specified abnormal findings of blood chemistry: Secondary | ICD-10-CM

## 2023-03-03 DIAGNOSIS — E559 Vitamin D deficiency, unspecified: Secondary | ICD-10-CM

## 2023-03-18 ENCOUNTER — Other Ambulatory Visit (INDEPENDENT_AMBULATORY_CARE_PROVIDER_SITE_OTHER): Payer: Commercial Managed Care - PPO

## 2023-03-18 DIAGNOSIS — R7989 Other specified abnormal findings of blood chemistry: Secondary | ICD-10-CM

## 2023-03-21 ENCOUNTER — Other Ambulatory Visit (HOSPITAL_COMMUNITY): Payer: Self-pay

## 2023-03-23 LAB — TESTOSTERONE,FREE AND TOTAL
Testosterone, Free: 5.6 pg/mL — ABNORMAL LOW (ref 7.2–24.0)
Testosterone: 230 ng/dL — ABNORMAL LOW (ref 264–916)

## 2023-03-25 ENCOUNTER — Other Ambulatory Visit: Payer: Self-pay | Admitting: Family Medicine

## 2023-03-25 DIAGNOSIS — E291 Testicular hypofunction: Secondary | ICD-10-CM

## 2023-06-10 ENCOUNTER — Other Ambulatory Visit (INDEPENDENT_AMBULATORY_CARE_PROVIDER_SITE_OTHER): Payer: Commercial Managed Care - PPO

## 2023-06-10 ENCOUNTER — Other Ambulatory Visit (HOSPITAL_COMMUNITY): Payer: Self-pay

## 2023-06-10 ENCOUNTER — Other Ambulatory Visit: Payer: Self-pay | Admitting: Family Medicine

## 2023-06-10 DIAGNOSIS — E559 Vitamin D deficiency, unspecified: Secondary | ICD-10-CM | POA: Diagnosis not present

## 2023-06-10 LAB — VITAMIN D 25 HYDROXY (VIT D DEFICIENCY, FRACTURES): VITD: 24.52 ng/mL — ABNORMAL LOW (ref 30.00–100.00)

## 2023-06-10 MED ORDER — VITAMIN D (ERGOCALCIFEROL) 1.25 MG (50000 UNIT) PO CAPS
50000.0000 [IU] | ORAL_CAPSULE | ORAL | 0 refills | Status: DC
  Filled 2023-06-10: qty 12, 84d supply, fill #0

## 2023-06-12 ENCOUNTER — Other Ambulatory Visit (HOSPITAL_COMMUNITY): Payer: Self-pay

## 2023-06-23 DIAGNOSIS — E291 Testicular hypofunction: Secondary | ICD-10-CM | POA: Diagnosis not present

## 2023-07-01 ENCOUNTER — Other Ambulatory Visit: Payer: Self-pay | Admitting: Family Medicine

## 2023-07-01 MED ORDER — FENOFIBRATE 145 MG PO TABS
145.0000 mg | ORAL_TABLET | Freq: Every day | ORAL | 1 refills | Status: DC
  Filled 2023-07-01: qty 90, 90d supply, fill #0
  Filled 2023-09-08 – 2023-09-27 (×2): qty 90, 90d supply, fill #1

## 2023-07-02 ENCOUNTER — Other Ambulatory Visit (HOSPITAL_COMMUNITY): Payer: Self-pay

## 2023-07-02 ENCOUNTER — Other Ambulatory Visit: Payer: Self-pay

## 2023-07-04 DIAGNOSIS — E291 Testicular hypofunction: Secondary | ICD-10-CM | POA: Diagnosis not present

## 2023-08-01 ENCOUNTER — Other Ambulatory Visit (HOSPITAL_COMMUNITY): Payer: Self-pay

## 2023-08-01 MED ORDER — "BD PRECISIONGLIDE NEEDLE 23G X 1-1/2"" MISC"
1 refills | Status: DC
  Filled 2023-08-01: qty 30, 30d supply, fill #0

## 2023-08-01 MED ORDER — TESTOSTERONE CYPIONATE 200 MG/ML IM SOLN
100.0000 mg | INTRAMUSCULAR | 1 refills | Status: DC
  Filled 2023-08-01: qty 6, 42d supply, fill #0

## 2023-08-01 MED ORDER — "BD ECLIPSE SHIELDED NEEDLE 18G X 1-1/2"" MISC"
3 refills | Status: DC
  Filled 2023-08-01: qty 30, 30d supply, fill #0

## 2023-08-04 ENCOUNTER — Other Ambulatory Visit (HOSPITAL_COMMUNITY): Payer: Self-pay

## 2023-08-05 ENCOUNTER — Other Ambulatory Visit (HOSPITAL_COMMUNITY): Payer: Self-pay

## 2023-08-08 ENCOUNTER — Other Ambulatory Visit (HOSPITAL_COMMUNITY): Payer: Self-pay

## 2023-09-03 ENCOUNTER — Encounter: Payer: Commercial Managed Care - PPO | Admitting: Family Medicine

## 2023-09-05 ENCOUNTER — Other Ambulatory Visit (HOSPITAL_COMMUNITY): Payer: Self-pay

## 2023-09-05 MED ORDER — JATENZO 237 MG PO CAPS
237.0000 mg | ORAL_CAPSULE | Freq: Two times a day (BID) | ORAL | 5 refills | Status: DC
  Filled 2023-09-05 – 2023-09-08 (×2): qty 60, 30d supply, fill #0
  Filled 2023-10-13: qty 60, 30d supply, fill #1
  Filled 2023-11-20: qty 60, 30d supply, fill #2
  Filled 2023-12-18: qty 60, 30d supply, fill #3

## 2023-09-08 ENCOUNTER — Other Ambulatory Visit (HOSPITAL_COMMUNITY): Payer: Self-pay

## 2023-09-08 ENCOUNTER — Other Ambulatory Visit: Payer: Self-pay | Admitting: Family Medicine

## 2023-09-08 ENCOUNTER — Other Ambulatory Visit: Payer: Self-pay

## 2023-09-08 MED ORDER — PRAVASTATIN SODIUM 20 MG PO TABS
20.0000 mg | ORAL_TABLET | Freq: Every day | ORAL | 1 refills | Status: DC
  Filled 2023-09-08 – 2023-09-27 (×2): qty 90, 90d supply, fill #0

## 2023-09-17 ENCOUNTER — Other Ambulatory Visit (HOSPITAL_COMMUNITY): Payer: Self-pay

## 2023-09-24 ENCOUNTER — Ambulatory Visit: Payer: Commercial Managed Care - PPO | Admitting: Family Medicine

## 2023-09-24 ENCOUNTER — Other Ambulatory Visit: Payer: Self-pay | Admitting: Family Medicine

## 2023-09-24 ENCOUNTER — Encounter: Payer: Self-pay | Admitting: Family Medicine

## 2023-09-24 ENCOUNTER — Other Ambulatory Visit (HOSPITAL_COMMUNITY): Payer: Self-pay

## 2023-09-24 VITALS — BP 118/64 | HR 83 | Temp 98.0°F | Resp 16 | Ht 73.0 in | Wt 233.6 lb

## 2023-09-24 DIAGNOSIS — Z125 Encounter for screening for malignant neoplasm of prostate: Secondary | ICD-10-CM

## 2023-09-24 DIAGNOSIS — Z0000 Encounter for general adult medical examination without abnormal findings: Secondary | ICD-10-CM | POA: Diagnosis not present

## 2023-09-24 DIAGNOSIS — E559 Vitamin D deficiency, unspecified: Secondary | ICD-10-CM | POA: Diagnosis not present

## 2023-09-24 DIAGNOSIS — Z23 Encounter for immunization: Secondary | ICD-10-CM

## 2023-09-24 LAB — COMPREHENSIVE METABOLIC PANEL
ALT: 21 U/L (ref 0–53)
AST: 15 U/L (ref 0–37)
Albumin: 4.9 g/dL (ref 3.5–5.2)
Alkaline Phosphatase: 50 U/L (ref 39–117)
BUN: 13 mg/dL (ref 6–23)
CO2: 29 meq/L (ref 19–32)
Calcium: 9.7 mg/dL (ref 8.4–10.5)
Chloride: 105 meq/L (ref 96–112)
Creatinine, Ser: 1.01 mg/dL (ref 0.40–1.50)
GFR: 84.01 mL/min (ref >60.00)
Glucose, Bld: 94 mg/dL (ref 70–99)
Potassium: 4.2 meq/L (ref 3.5–5.1)
Sodium: 140 meq/L (ref 135–145)
Total Bilirubin: 0.5 mg/dL (ref 0.2–1.2)
Total Protein: 7.4 g/dL (ref 6.0–8.3)

## 2023-09-24 LAB — LIPID PANEL
Cholesterol: 168 mg/dL (ref 0–200)
HDL: 24.8 mg/dL — ABNORMAL LOW (ref >39.00)
LDL Cholesterol: 100 mg/dL — ABNORMAL HIGH (ref 0–99)
NonHDL: 143.55
Total CHOL/HDL Ratio: 7
Triglycerides: 217 mg/dL — ABNORMAL HIGH (ref 0.0–149.0)
VLDL: 43.4 mg/dL — ABNORMAL HIGH (ref 0.0–40.0)

## 2023-09-24 LAB — CBC
HCT: 46 % (ref 39.0–52.0)
Hemoglobin: 15.6 g/dL (ref 13.0–17.0)
MCHC: 33.9 g/dL (ref 30.0–36.0)
MCV: 94.4 fL (ref 78.0–100.0)
Platelets: 243 10*3/uL (ref 150.0–400.0)
RBC: 4.87 Mil/uL (ref 4.22–5.81)
RDW: 13.5 % (ref 11.5–15.5)
WBC: 6.8 10*3/uL (ref 4.0–10.5)

## 2023-09-24 LAB — VITAMIN D 25 HYDROXY (VIT D DEFICIENCY, FRACTURES): VITD: 21.87 ng/mL — ABNORMAL LOW (ref 30.00–100.00)

## 2023-09-24 LAB — PSA: PSA: 1.98 ng/mL (ref 0.10–4.00)

## 2023-09-24 MED ORDER — VITAMIN D (ERGOCALCIFEROL) 1.25 MG (50000 UNIT) PO CAPS
50000.0000 [IU] | ORAL_CAPSULE | ORAL | 0 refills | Status: DC
  Filled 2023-09-24: qty 12, 84d supply, fill #0

## 2023-09-24 NOTE — Progress Notes (Signed)
Chief Complaint  Patient presents with   Annual Exam    Annual Exam    Well Male Jason Benson is here for a complete physical.   His last physical was >1 year ago.  Current diet: in general, a "healthy" diet.  Current exercise: active at work Weight trend: stable Fatigue out of ordinary? No. Seat belt? Yes.   Advanced directive? Yes  Health maintenance Shingrix- Yes Colonoscopy- Yes Tetanus- Yes HIV- Yes Hep C- Yes   Past Medical History:  Diagnosis Date   Hyperlipidemia       Past Surgical History:  Procedure Laterality Date   VASECTOMY     WISDOM TOOTH EXTRACTION      Medications  Current Outpatient Medications on File Prior to Visit  Medication Sig Dispense Refill   aspirin EC 81 MG tablet Take 1 tablet (81 mg total) by mouth daily. 30 tablet 1   fenofibrate (TRICOR) 145 MG tablet Take 1 tablet by mouth daily. 90 tablet 1   pravastatin (PRAVACHOL) 20 MG tablet Take 1 tablet (20 mg) by mouth daily. 90 tablet 1   Testosterone Undecanoate (JATENZO) 237 MG CAPS Take 1 capsule (237 mg total) by mouth 2 (two) times daily (in the morning and evening) with food 60 capsule 5    Allergies No Known Allergies  Family History Family History  Problem Relation Age of Onset   Diabetes Father    Heart attack Father 38   Lung cancer Paternal Uncle    Stroke Mother 79   Hyperlipidemia Sister    Colon polyps Neg Hx    Colon cancer Neg Hx    Esophageal cancer Neg Hx    Rectal cancer Neg Hx    Stomach cancer Neg Hx     Review of Systems: Constitutional:  no fevers Eye:  no recent significant change in vision Ear/Nose/Mouth/Throat:  Ears:  no hearing loss Nose/Mouth/Throat:  no complaints of nasal congestion, no sore throat Cardiovascular:  no chest pain Respiratory:  no shortness of breath Gastrointestinal:  no change in bowel habits GU:  Male: negative for dysuria, frequency Musculoskeletal/Extremities:  no joint pain Integumentary (Skin/Breast):  no abnormal  skin lesions reported Neurologic:  no headaches Endocrine: No unexpected weight changes Hematologic/Lymphatic:  no abnormal bleeding  Exam BP 118/64 (BP Location: Left Arm, Patient Position: Sitting, Cuff Size: Large)   Pulse 83   Temp 98 F (36.7 C) (Oral)   Resp 16   Ht 6\' 1"  (1.854 m)   Wt 233 lb 9.6 oz (106 kg)   SpO2 95%   BMI 30.82 kg/m  General:  well developed, well nourished, in no apparent distress Skin:  no significant moles, warts, or growths Head:  no masses, lesions, or tenderness Eyes:  pupils equal and round, sclera anicteric without injection Ears:  canals without lesions, TMs shiny without retraction, no obvious effusion, no erythema Nose:  nares patent, mucosa normal Throat/Pharynx:  lips and gingiva without lesion; tongue and uvula midline; non-inflamed pharynx; no exudates or postnasal drainage Neck: neck supple without adenopathy, thyromegaly, or masses Cardiac: RRR, no bruits, no LE edema Lungs:  clear to auscultation, breath sounds equal bilaterally, no respiratory distress Abdomen: BS+, soft, non-tender, non-distended, no masses or organomegaly noted Rectal: Deferred Musculoskeletal:  symmetrical muscle groups noted without atrophy or deformity Neuro:  gait normal; deep tendon reflexes normal and symmetric Psych: well oriented with normal range of affect and appropriate judgment/insight  Assessment and Plan  Well adult exam - Plan: CBC, Comprehensive metabolic  panel, Lipid panel  Screening for prostate cancer - Plan: PSA  Vitamin D deficiency - Plan: VITAMIN D 25 Hydroxy (Vit-D Deficiency, Fractures)   Well 57 y.o. male. Counseled on diet and exercise. Counseled on risks and benefits of prostate cancer screening with PSA. The patient agrees to undergo testing. Advanced directive form provided today.  Immunizations, labs, and further orders as above. Flu shot today.  Follow up in 6 mo. The patient voiced understanding and agreement to the  plan.  Jilda Roche [place], DO 09/24/23 12:42 PM

## 2023-09-24 NOTE — Addendum Note (Signed)
Addended by: Kathi Ludwig on: 09/24/2023 01:02 PM   Modules accepted: Orders

## 2023-09-24 NOTE — Patient Instructions (Signed)
Give us 2-3 business days to get the results of your labs back.   Keep the diet clean and stay active.  Please get me a copy of your advanced directive form at your convenience.   Let us know if you need anything.  

## 2023-09-27 ENCOUNTER — Other Ambulatory Visit (HOSPITAL_COMMUNITY): Payer: Self-pay

## 2023-09-29 ENCOUNTER — Other Ambulatory Visit: Payer: Self-pay

## 2023-09-29 ENCOUNTER — Other Ambulatory Visit (HOSPITAL_COMMUNITY): Payer: Self-pay

## 2023-09-30 ENCOUNTER — Other Ambulatory Visit: Payer: Self-pay

## 2023-09-30 DIAGNOSIS — E782 Mixed hyperlipidemia: Secondary | ICD-10-CM

## 2023-09-30 DIAGNOSIS — E559 Vitamin D deficiency, unspecified: Secondary | ICD-10-CM

## 2023-09-30 DIAGNOSIS — R7989 Other specified abnormal findings of blood chemistry: Secondary | ICD-10-CM

## 2023-09-30 DIAGNOSIS — Z125 Encounter for screening for malignant neoplasm of prostate: Secondary | ICD-10-CM

## 2023-10-14 ENCOUNTER — Other Ambulatory Visit: Payer: Self-pay

## 2023-10-14 ENCOUNTER — Telehealth: Payer: Self-pay

## 2023-10-14 ENCOUNTER — Other Ambulatory Visit (HOSPITAL_COMMUNITY): Payer: Self-pay

## 2023-10-14 NOTE — Telephone Encounter (Signed)
Called pt regarding rechecking lab in 8 weeks  Appt scheduled. Pt seen results.

## 2023-10-16 DIAGNOSIS — E291 Testicular hypofunction: Secondary | ICD-10-CM | POA: Diagnosis not present

## 2023-11-11 ENCOUNTER — Other Ambulatory Visit: Payer: Commercial Managed Care - PPO

## 2023-11-20 ENCOUNTER — Other Ambulatory Visit: Payer: Self-pay | Admitting: Family Medicine

## 2023-11-20 ENCOUNTER — Other Ambulatory Visit: Payer: Self-pay

## 2023-11-21 ENCOUNTER — Other Ambulatory Visit: Payer: Self-pay

## 2023-11-21 ENCOUNTER — Encounter: Payer: Self-pay | Admitting: Family Medicine

## 2023-11-21 ENCOUNTER — Other Ambulatory Visit (HOSPITAL_COMMUNITY): Payer: Self-pay

## 2023-11-21 ENCOUNTER — Other Ambulatory Visit: Payer: Self-pay | Admitting: Family Medicine

## 2023-11-21 ENCOUNTER — Other Ambulatory Visit (INDEPENDENT_AMBULATORY_CARE_PROVIDER_SITE_OTHER): Payer: Commercial Managed Care - PPO

## 2023-11-21 DIAGNOSIS — E782 Mixed hyperlipidemia: Secondary | ICD-10-CM

## 2023-11-21 DIAGNOSIS — E559 Vitamin D deficiency, unspecified: Secondary | ICD-10-CM

## 2023-11-21 DIAGNOSIS — Z125 Encounter for screening for malignant neoplasm of prostate: Secondary | ICD-10-CM

## 2023-11-21 LAB — LIPID PANEL
Cholesterol: 170 mg/dL (ref 0–200)
HDL: 28.4 mg/dL — ABNORMAL LOW (ref >39.00)
LDL Cholesterol: 108 mg/dL — ABNORMAL HIGH (ref 0–99)
NonHDL: 141.63
Total CHOL/HDL Ratio: 6
Triglycerides: 169 mg/dL — ABNORMAL HIGH (ref 0.0–149.0)
VLDL: 33.8 mg/dL (ref 0.0–40.0)

## 2023-11-21 LAB — PSA: PSA: 2.15 ng/mL (ref 0.10–4.00)

## 2023-11-21 LAB — VITAMIN D 25 HYDROXY (VIT D DEFICIENCY, FRACTURES): VITD: 27.22 ng/mL — ABNORMAL LOW (ref 30.00–100.00)

## 2023-11-21 MED ORDER — ROSUVASTATIN CALCIUM 20 MG PO TABS
20.0000 mg | ORAL_TABLET | Freq: Every day | ORAL | 3 refills | Status: AC
  Filled 2023-11-21: qty 90, 90d supply, fill #0
  Filled 2024-01-22 – 2024-02-25 (×2): qty 90, 90d supply, fill #1
  Filled 2024-05-31: qty 90, 90d supply, fill #2
  Filled 2024-07-31 – 2024-08-23 (×3): qty 90, 90d supply, fill #3

## 2023-11-21 MED ORDER — VITAMIN D (ERGOCALCIFEROL) 1.25 MG (50000 UNIT) PO CAPS
50000.0000 [IU] | ORAL_CAPSULE | ORAL | 0 refills | Status: AC
  Filled 2023-11-21 – 2023-12-18 (×2): qty 12, 84d supply, fill #0

## 2023-11-22 ENCOUNTER — Other Ambulatory Visit (HOSPITAL_COMMUNITY): Payer: Self-pay

## 2023-12-18 ENCOUNTER — Other Ambulatory Visit: Payer: Self-pay

## 2023-12-18 ENCOUNTER — Other Ambulatory Visit (HOSPITAL_COMMUNITY): Payer: Self-pay

## 2023-12-20 ENCOUNTER — Other Ambulatory Visit (HOSPITAL_COMMUNITY): Payer: Self-pay

## 2024-01-22 ENCOUNTER — Other Ambulatory Visit (HOSPITAL_COMMUNITY): Payer: Self-pay

## 2024-01-22 ENCOUNTER — Other Ambulatory Visit: Payer: Self-pay | Admitting: Family Medicine

## 2024-01-23 ENCOUNTER — Other Ambulatory Visit: Payer: Self-pay

## 2024-01-23 ENCOUNTER — Other Ambulatory Visit (HOSPITAL_COMMUNITY): Payer: Self-pay

## 2024-01-23 MED ORDER — FENOFIBRATE 145 MG PO TABS
145.0000 mg | ORAL_TABLET | Freq: Every day | ORAL | 1 refills | Status: DC
  Filled 2024-01-23: qty 90, 90d supply, fill #0
  Filled 2024-04-28: qty 90, 90d supply, fill #1

## 2024-01-26 ENCOUNTER — Other Ambulatory Visit (HOSPITAL_COMMUNITY): Payer: Self-pay

## 2024-01-27 ENCOUNTER — Other Ambulatory Visit (HOSPITAL_COMMUNITY): Payer: Self-pay

## 2024-01-29 ENCOUNTER — Other Ambulatory Visit (HOSPITAL_COMMUNITY): Payer: Self-pay

## 2024-01-29 MED ORDER — JATENZO 237 MG PO CAPS
237.0000 mg | ORAL_CAPSULE | Freq: Two times a day (BID) | ORAL | 5 refills | Status: DC
  Filled 2024-01-29: qty 60, 30d supply, fill #0
  Filled 2024-02-25: qty 60, 30d supply, fill #1
  Filled 2024-04-02: qty 60, 30d supply, fill #2
  Filled 2024-05-31: qty 60, 30d supply, fill #3
  Filled 2024-07-01: qty 60, 30d supply, fill #4

## 2024-01-30 ENCOUNTER — Other Ambulatory Visit (HOSPITAL_COMMUNITY): Payer: Self-pay

## 2024-02-11 ENCOUNTER — Encounter: Payer: Self-pay | Admitting: Family Medicine

## 2024-02-11 ENCOUNTER — Other Ambulatory Visit (INDEPENDENT_AMBULATORY_CARE_PROVIDER_SITE_OTHER)

## 2024-02-11 DIAGNOSIS — E559 Vitamin D deficiency, unspecified: Secondary | ICD-10-CM

## 2024-02-11 DIAGNOSIS — E782 Mixed hyperlipidemia: Secondary | ICD-10-CM

## 2024-02-11 LAB — LIPID PANEL
Cholesterol: 92 mg/dL (ref 0–200)
HDL: 25.8 mg/dL — ABNORMAL LOW
LDL Cholesterol: 42 mg/dL (ref 0–99)
NonHDL: 66.34
Total CHOL/HDL Ratio: 4
Triglycerides: 124 mg/dL (ref 0.0–149.0)
VLDL: 24.8 mg/dL (ref 0.0–40.0)

## 2024-02-11 LAB — VITAMIN D 25 HYDROXY (VIT D DEFICIENCY, FRACTURES): VITD: 42.21 ng/mL (ref 30.00–100.00)

## 2024-02-25 ENCOUNTER — Other Ambulatory Visit (HOSPITAL_COMMUNITY): Payer: Self-pay

## 2024-02-25 ENCOUNTER — Other Ambulatory Visit: Payer: Self-pay

## 2024-04-06 ENCOUNTER — Other Ambulatory Visit (HOSPITAL_COMMUNITY): Payer: Self-pay

## 2024-04-22 ENCOUNTER — Other Ambulatory Visit (HOSPITAL_COMMUNITY): Payer: Self-pay

## 2024-04-22 DIAGNOSIS — E291 Testicular hypofunction: Secondary | ICD-10-CM | POA: Diagnosis not present

## 2024-04-22 MED ORDER — JATENZO 237 MG PO CAPS
237.0000 mg | ORAL_CAPSULE | Freq: Two times a day (BID) | ORAL | 5 refills | Status: AC
  Filled 2024-04-28 – 2024-05-01 (×2): qty 60, 30d supply, fill #0
  Filled 2024-08-06: qty 60, 30d supply, fill #1
  Filled 2024-08-23 – 2024-09-17 (×4): qty 60, 30d supply, fill #2

## 2024-04-23 ENCOUNTER — Other Ambulatory Visit (HOSPITAL_COMMUNITY): Payer: Self-pay

## 2024-04-28 ENCOUNTER — Other Ambulatory Visit (HOSPITAL_COMMUNITY): Payer: Self-pay

## 2024-04-28 ENCOUNTER — Other Ambulatory Visit: Payer: Self-pay

## 2024-04-29 DIAGNOSIS — E291 Testicular hypofunction: Secondary | ICD-10-CM | POA: Diagnosis not present

## 2024-05-01 ENCOUNTER — Other Ambulatory Visit (HOSPITAL_COMMUNITY): Payer: Self-pay

## 2024-05-03 ENCOUNTER — Other Ambulatory Visit (HOSPITAL_COMMUNITY): Payer: Self-pay

## 2024-06-01 ENCOUNTER — Other Ambulatory Visit (HOSPITAL_COMMUNITY): Payer: Self-pay

## 2024-06-01 ENCOUNTER — Other Ambulatory Visit: Payer: Self-pay

## 2024-06-02 ENCOUNTER — Other Ambulatory Visit (HOSPITAL_COMMUNITY): Payer: Self-pay

## 2024-07-01 ENCOUNTER — Other Ambulatory Visit: Payer: Self-pay

## 2024-07-03 ENCOUNTER — Other Ambulatory Visit (HOSPITAL_COMMUNITY): Payer: Self-pay

## 2024-07-31 ENCOUNTER — Other Ambulatory Visit (HOSPITAL_COMMUNITY): Payer: Self-pay

## 2024-07-31 ENCOUNTER — Other Ambulatory Visit: Payer: Self-pay | Admitting: Family Medicine

## 2024-08-02 ENCOUNTER — Other Ambulatory Visit: Payer: Self-pay

## 2024-08-02 ENCOUNTER — Other Ambulatory Visit (HOSPITAL_COMMUNITY): Payer: Self-pay

## 2024-08-02 MED ORDER — FENOFIBRATE 145 MG PO TABS
145.0000 mg | ORAL_TABLET | Freq: Every day | ORAL | 1 refills | Status: AC
  Filled 2024-08-02: qty 90, 90d supply, fill #0
  Filled 2024-11-03: qty 90, 90d supply, fill #1

## 2024-08-03 ENCOUNTER — Encounter (HOSPITAL_COMMUNITY): Payer: Self-pay

## 2024-08-03 ENCOUNTER — Other Ambulatory Visit (HOSPITAL_COMMUNITY): Payer: Self-pay

## 2024-08-06 ENCOUNTER — Other Ambulatory Visit (HOSPITAL_COMMUNITY): Payer: Self-pay

## 2024-08-09 ENCOUNTER — Other Ambulatory Visit (HOSPITAL_COMMUNITY): Payer: Self-pay

## 2024-08-09 MED ORDER — JATENZO 237 MG PO CAPS
474.0000 mg | ORAL_CAPSULE | Freq: Two times a day (BID) | ORAL | 5 refills | Status: AC
  Filled 2024-08-09 – 2024-11-10 (×4): qty 60, 15d supply, fill #0
  Filled 2024-11-24: qty 60, 15d supply, fill #1

## 2024-08-10 ENCOUNTER — Other Ambulatory Visit (HOSPITAL_COMMUNITY): Payer: Self-pay

## 2024-08-23 ENCOUNTER — Other Ambulatory Visit: Payer: Self-pay

## 2024-08-23 ENCOUNTER — Other Ambulatory Visit (HOSPITAL_COMMUNITY): Payer: Self-pay

## 2024-08-26 ENCOUNTER — Other Ambulatory Visit (HOSPITAL_COMMUNITY): Payer: Self-pay

## 2024-09-09 DIAGNOSIS — H524 Presbyopia: Secondary | ICD-10-CM | POA: Diagnosis not present

## 2024-09-15 ENCOUNTER — Other Ambulatory Visit (HOSPITAL_COMMUNITY): Payer: Self-pay

## 2024-09-17 ENCOUNTER — Other Ambulatory Visit (HOSPITAL_COMMUNITY): Payer: Self-pay

## 2024-10-05 ENCOUNTER — Other Ambulatory Visit (HOSPITAL_COMMUNITY): Payer: Self-pay

## 2024-10-08 DIAGNOSIS — E291 Testicular hypofunction: Secondary | ICD-10-CM | POA: Diagnosis not present

## 2024-10-12 ENCOUNTER — Other Ambulatory Visit (HOSPITAL_COMMUNITY): Payer: Self-pay

## 2024-10-12 ENCOUNTER — Other Ambulatory Visit: Payer: Self-pay

## 2024-10-23 ENCOUNTER — Other Ambulatory Visit (HOSPITAL_COMMUNITY): Payer: Self-pay

## 2024-10-23 ENCOUNTER — Telehealth: Admitting: Nurse Practitioner

## 2024-10-23 DIAGNOSIS — J069 Acute upper respiratory infection, unspecified: Secondary | ICD-10-CM | POA: Diagnosis not present

## 2024-10-23 MED ORDER — PSEUDOEPH-BROMPHEN-DM 30-2-10 MG/5ML PO SYRP
5.0000 mL | ORAL_SOLUTION | Freq: Four times a day (QID) | ORAL | 0 refills | Status: AC | PRN
  Filled 2024-10-23: qty 240, 12d supply, fill #0

## 2024-10-23 MED ORDER — IBUPROFEN 600 MG PO TABS
600.0000 mg | ORAL_TABLET | Freq: Three times a day (TID) | ORAL | 0 refills | Status: AC | PRN
  Filled 2024-10-23: qty 30, 10d supply, fill #0

## 2024-10-23 NOTE — Progress Notes (Signed)
 Hello!!   We do not treat for the flu with tamilflu unless you can upload a positive over the counter flu test result or can confirm that you have been in close contact with someone in your home who is positive for the flu and being treated. If not we will treat for your cough and fever with the following:   BROMFED COUGH SYRUP FOR COUGH and MOTRIN  600 mg for fever, headache or body aches.   E visit for Flu like symptoms   We are sorry that you are not feeling well.  Here is how we plan to help! Based on what you have shared with me it looks like you may have a respiratory virus that may be influenza.  Influenza or the flu is  an infection caused by a respiratory virus. The flu virus is highly contagious and persons who did not receive their yearly flu vaccination may catch the flu from close contact.  We have anti-viral medications to treat the viruses that cause this infection. They are not a cure and only shorten the course of the infection. These prescriptions are most effective when they are given within the first 2 days of flu symptoms. Antiviral medications are indicated if you have a high risk of complications from the flu. You should  also consider an antiviral medication if you are in close contact with someone who is at risk. These medications can help patients avoid complications from the flu but have side effects that you should know.   Possible side effects from Tamiflu or oseltamivir include nausea, vomiting, diarrhea, dizziness, headaches, eye redness, sleep problems or other respiratory symptoms. You should not take Tamiflu if you have an allergy to oseltamivir or any to the ingredients in Tamiflu.   For nasal congestion, you may use an oral decongestant such as Mucinex D or if you have glaucoma or high blood pressure use plain Mucinex.  Saline nasal spray or nasal drops can help and can safely be used as often as needed for congestion.  If you have a sore or scratchy  throat, use a saltwater gargle-  to  teaspoon of salt dissolved in a 4-ounce to 8-ounce glass of warm water.  Gargle the solution for approximately 15-30 seconds and then spit.  It is important not to swallow the solution.  You can also use throat lozenges/cough drops and Chloraseptic spray to help with throat pain or discomfort.  Warm or cold liquids can also be helpful in relieving throat pain.  For headache, pain or general discomfort, you can use Ibuprofen  or Tylenol as directed.   Some authorities believe that zinc sprays or the use of Echinacea may shorten the course of your symptoms.   You are to isolate at home until you have been fever-free for at least 24 hours without a fever-reducing medication, and symptoms have been steadily improving for 24 hours.  If you must be around other household members who do not have symptoms, you need to make sure that both you and the family members are masking consistently with a high-quality mask.  If you note any worsening of symptoms despite treatment, please seek an in-person evaluation ASAP. If you note any significant shortness of breath or any chest pain, please seek ED evaluation. Please do not delay care!  ANYONE WHO HAS FLU SYMPTOMS SHOULD: Stay home. The flu is highly contagious and going out or to work exposes others! Be sure to drink plenty of fluids. Water is fine as well as  fruit juices, sodas and electrolyte beverages. You may want to stay away from caffeine or alcohol. If you are nauseated, try taking small sips of liquids. How do you know if you are getting enough fluid? Your urine should be a pale yellow or almost colorless. Get rest. Taking a steamy shower or using a humidifier may help nasal congestion and ease sore throat pain. Using a saline nasal spray works much the same way. Cough drops, hard candies and sore throat lozenges may ease your cough. Line up a caregiver. Have someone check on you regularly.  GET HELP RIGHT AWAY  IF: You cannot keep down liquids or your medications. You become short of breath Your fell like you are going to pass out or loose consciousness. Your symptoms persist after you have completed your treatment plan  MAKE SURE YOU  Understand these instructions. Will watch your condition. Will get help right away if you are not doing well or get worse.  Your e-visit answers were reviewed by a board certified advanced clinical practitioner to complete your personal care plan.  Depending on the condition, your plan could have included both over the counter or prescription medications.  If there is a problem please reply  once you have received a response from your provider.  Your safety is important to us .  If you have drug allergies check your prescription carefully.    You can use MyChart to ask questions about todays visit, request a non-urgent call back, or ask for a work or school excuse for 24 hours related to this e-Visit. If it has been greater than 24 hours you will need to follow up with your provider, or enter a new e-Visit to address those concerns.  You will get an e-mail in the next two days asking about your experience.  I hope that your e-visit has been valuable and will speed your recovery. Thank you for using e-visits.   I have spent 5 minutes in review of e-visit questionnaire, review and updating patient chart, medical decision making and response to patient.   Kavita Bartl W Delman [place], NP

## 2024-10-24 ENCOUNTER — Other Ambulatory Visit (HOSPITAL_COMMUNITY): Payer: Self-pay

## 2024-11-03 ENCOUNTER — Other Ambulatory Visit (HOSPITAL_COMMUNITY): Payer: Self-pay

## 2024-11-04 ENCOUNTER — Other Ambulatory Visit: Payer: Self-pay

## 2024-11-05 ENCOUNTER — Other Ambulatory Visit: Payer: Self-pay

## 2024-11-05 ENCOUNTER — Other Ambulatory Visit (HOSPITAL_COMMUNITY): Payer: Self-pay

## 2024-11-10 ENCOUNTER — Other Ambulatory Visit (HOSPITAL_COMMUNITY): Payer: Self-pay

## 2024-11-11 ENCOUNTER — Other Ambulatory Visit (HOSPITAL_COMMUNITY): Payer: Self-pay

## 2024-11-15 ENCOUNTER — Other Ambulatory Visit (HOSPITAL_COMMUNITY): Payer: Self-pay

## 2024-11-24 ENCOUNTER — Other Ambulatory Visit: Payer: Self-pay | Admitting: Family Medicine

## 2024-11-24 ENCOUNTER — Other Ambulatory Visit: Payer: Self-pay

## 2024-11-29 ENCOUNTER — Other Ambulatory Visit (HOSPITAL_COMMUNITY): Payer: Self-pay

## 2024-12-02 ENCOUNTER — Other Ambulatory Visit: Payer: Self-pay | Admitting: Family Medicine

## 2024-12-03 ENCOUNTER — Other Ambulatory Visit: Payer: Self-pay | Admitting: Family Medicine
